# Patient Record
Sex: Female | Born: 1950
Health system: Southern US, Community
[De-identification: ages and names within clinical notes are randomized; demographics above are authoritative.]

## PROBLEM LIST (undated history)

## (undated) DIAGNOSIS — K219 Gastro-esophageal reflux disease without esophagitis: Secondary | ICD-10-CM

## (undated) DIAGNOSIS — M8580 Other specified disorders of bone density and structure, unspecified site: Secondary | ICD-10-CM

## (undated) DIAGNOSIS — E119 Type 2 diabetes mellitus without complications: Secondary | ICD-10-CM

## (undated) DIAGNOSIS — E559 Vitamin D deficiency, unspecified: Secondary | ICD-10-CM

## (undated) DIAGNOSIS — E785 Hyperlipidemia, unspecified: Secondary | ICD-10-CM

## (undated) DIAGNOSIS — Z780 Asymptomatic menopausal state: Secondary | ICD-10-CM

## (undated) DIAGNOSIS — I071 Rheumatic tricuspid insufficiency: Secondary | ICD-10-CM

## (undated) DIAGNOSIS — F410 Panic disorder [episodic paroxysmal anxiety] without agoraphobia: Secondary | ICD-10-CM

## (undated) DIAGNOSIS — I10 Essential (primary) hypertension: Secondary | ICD-10-CM

## (undated) HISTORY — PX: OVARIAN CYST REMOVAL: SHX89

## (undated) HISTORY — DX: Gastro-esophageal reflux disease without esophagitis: K21.9

## (undated) HISTORY — PX: CATARACT EXTRACTION: SUR2

## (undated) HISTORY — DX: Rheumatic tricuspid insufficiency: I07.1

## (undated) HISTORY — PX: OTHER SURGICAL HISTORY: SHX169

## (undated) HISTORY — DX: Asymptomatic menopausal state: Z78.0

## (undated) HISTORY — DX: Other specified disorders of bone density and structure, unspecified site: M85.80

## (undated) HISTORY — DX: Vitamin D deficiency, unspecified: E55.9

## (undated) HISTORY — DX: Essential (primary) hypertension: I10

## (undated) HISTORY — DX: Panic disorder (episodic paroxysmal anxiety): F41.0

## (undated) HISTORY — DX: Type 2 diabetes mellitus without complications: E11.9

## (undated) HISTORY — DX: Hyperlipidemia, unspecified: E78.5

---

## 1997-12-24 ENCOUNTER — Observation Stay (HOSPITAL_COMMUNITY): Admission: RE | Admit: 1997-12-24 | Discharge: 1997-12-25 | Payer: Self-pay | Admitting: Obstetrics and Gynecology

## 1999-12-15 ENCOUNTER — Encounter: Payer: Self-pay | Admitting: Internal Medicine

## 1999-12-15 ENCOUNTER — Encounter: Admission: RE | Admit: 1999-12-15 | Discharge: 1999-12-15 | Payer: Self-pay | Admitting: Internal Medicine

## 2000-10-05 ENCOUNTER — Ambulatory Visit (HOSPITAL_COMMUNITY): Admission: RE | Admit: 2000-10-05 | Discharge: 2000-10-05 | Payer: Self-pay | Admitting: Gynecology

## 2000-11-01 ENCOUNTER — Other Ambulatory Visit: Admission: RE | Admit: 2000-11-01 | Discharge: 2000-11-01 | Payer: Self-pay | Admitting: Gynecology

## 2000-12-19 ENCOUNTER — Encounter: Admission: RE | Admit: 2000-12-19 | Discharge: 2000-12-19 | Payer: Self-pay | Admitting: Internal Medicine

## 2000-12-19 ENCOUNTER — Encounter: Payer: Self-pay | Admitting: Internal Medicine

## 2001-01-13 ENCOUNTER — Ambulatory Visit: Admission: RE | Admit: 2001-01-13 | Discharge: 2001-01-13 | Payer: Self-pay | Admitting: Internal Medicine

## 2001-05-01 ENCOUNTER — Other Ambulatory Visit: Admission: RE | Admit: 2001-05-01 | Discharge: 2001-05-01 | Payer: Self-pay | Admitting: Gynecology

## 2001-11-27 ENCOUNTER — Other Ambulatory Visit: Admission: RE | Admit: 2001-11-27 | Discharge: 2001-11-27 | Payer: Self-pay | Admitting: Gynecology

## 2002-01-17 ENCOUNTER — Encounter: Admission: RE | Admit: 2002-01-17 | Discharge: 2002-01-17 | Payer: Self-pay | Admitting: Internal Medicine

## 2002-01-17 ENCOUNTER — Encounter: Payer: Self-pay | Admitting: Internal Medicine

## 2002-05-01 ENCOUNTER — Other Ambulatory Visit: Admission: RE | Admit: 2002-05-01 | Discharge: 2002-05-01 | Payer: Self-pay | Admitting: Gynecology

## 2003-01-21 ENCOUNTER — Encounter: Payer: Self-pay | Admitting: Internal Medicine

## 2003-01-21 ENCOUNTER — Ambulatory Visit (HOSPITAL_COMMUNITY): Admission: RE | Admit: 2003-01-21 | Discharge: 2003-01-21 | Payer: Self-pay | Admitting: Internal Medicine

## 2003-02-19 ENCOUNTER — Other Ambulatory Visit: Admission: RE | Admit: 2003-02-19 | Discharge: 2003-02-19 | Payer: Self-pay | Admitting: Gynecology

## 2003-03-06 ENCOUNTER — Encounter: Admission: RE | Admit: 2003-03-06 | Discharge: 2003-03-06 | Payer: Self-pay | Admitting: Gynecology

## 2003-03-06 ENCOUNTER — Encounter: Payer: Self-pay | Admitting: Gynecology

## 2003-09-04 ENCOUNTER — Encounter: Payer: Self-pay | Admitting: Internal Medicine

## 2003-09-04 ENCOUNTER — Encounter: Admission: RE | Admit: 2003-09-04 | Discharge: 2003-09-04 | Payer: Self-pay | Admitting: Internal Medicine

## 2004-01-01 ENCOUNTER — Encounter: Admission: RE | Admit: 2004-01-01 | Discharge: 2004-03-31 | Payer: Self-pay | Admitting: Internal Medicine

## 2004-01-22 ENCOUNTER — Ambulatory Visit (HOSPITAL_COMMUNITY): Admission: RE | Admit: 2004-01-22 | Discharge: 2004-01-22 | Payer: Self-pay | Admitting: Internal Medicine

## 2004-02-20 ENCOUNTER — Other Ambulatory Visit: Admission: RE | Admit: 2004-02-20 | Discharge: 2004-02-20 | Payer: Self-pay | Admitting: Gynecology

## 2004-04-29 ENCOUNTER — Encounter: Admission: RE | Admit: 2004-04-29 | Discharge: 2004-07-28 | Payer: Self-pay | Admitting: Internal Medicine

## 2004-10-21 ENCOUNTER — Encounter: Admission: RE | Admit: 2004-10-21 | Discharge: 2004-10-21 | Payer: Self-pay | Admitting: Internal Medicine

## 2005-01-21 ENCOUNTER — Encounter: Admission: RE | Admit: 2005-01-21 | Discharge: 2005-04-21 | Payer: Self-pay | Admitting: Internal Medicine

## 2005-01-26 ENCOUNTER — Ambulatory Visit (HOSPITAL_COMMUNITY): Admission: RE | Admit: 2005-01-26 | Discharge: 2005-01-26 | Payer: Self-pay | Admitting: Gynecology

## 2005-02-23 ENCOUNTER — Other Ambulatory Visit: Admission: RE | Admit: 2005-02-23 | Discharge: 2005-02-23 | Payer: Self-pay | Admitting: Gynecology

## 2005-07-20 ENCOUNTER — Encounter: Admission: RE | Admit: 2005-07-20 | Discharge: 2005-07-20 | Payer: Self-pay | Admitting: Internal Medicine

## 2005-07-27 ENCOUNTER — Encounter: Admission: RE | Admit: 2005-07-27 | Discharge: 2005-10-25 | Payer: Self-pay | Admitting: Internal Medicine

## 2006-02-02 ENCOUNTER — Ambulatory Visit (HOSPITAL_COMMUNITY): Admission: RE | Admit: 2006-02-02 | Discharge: 2006-02-02 | Payer: Self-pay | Admitting: Internal Medicine

## 2006-03-02 ENCOUNTER — Encounter: Admission: RE | Admit: 2006-03-02 | Discharge: 2006-03-02 | Payer: Self-pay | Admitting: Internal Medicine

## 2006-03-07 ENCOUNTER — Other Ambulatory Visit: Admission: RE | Admit: 2006-03-07 | Discharge: 2006-03-07 | Payer: Self-pay | Admitting: Gynecology

## 2006-07-26 ENCOUNTER — Other Ambulatory Visit: Admission: RE | Admit: 2006-07-26 | Discharge: 2006-07-26 | Payer: Self-pay | Admitting: Internal Medicine

## 2007-02-14 ENCOUNTER — Ambulatory Visit (HOSPITAL_COMMUNITY): Admission: RE | Admit: 2007-02-14 | Discharge: 2007-02-14 | Payer: Self-pay | Admitting: Unknown Physician Specialty

## 2007-05-31 ENCOUNTER — Other Ambulatory Visit: Admission: RE | Admit: 2007-05-31 | Discharge: 2007-05-31 | Payer: Self-pay | Admitting: Obstetrics and Gynecology

## 2007-08-02 ENCOUNTER — Encounter: Admission: RE | Admit: 2007-08-02 | Discharge: 2007-08-02 | Payer: Self-pay | Admitting: Family Medicine

## 2008-02-16 ENCOUNTER — Ambulatory Visit (HOSPITAL_COMMUNITY): Admission: RE | Admit: 2008-02-16 | Discharge: 2008-02-16 | Payer: Self-pay | Admitting: Family Medicine

## 2009-01-29 ENCOUNTER — Other Ambulatory Visit: Admission: RE | Admit: 2009-01-29 | Discharge: 2009-01-29 | Payer: Self-pay | Admitting: Family Medicine

## 2009-02-05 ENCOUNTER — Encounter: Admission: RE | Admit: 2009-02-05 | Discharge: 2009-02-05 | Payer: Self-pay | Admitting: Otolaryngology

## 2009-02-24 ENCOUNTER — Ambulatory Visit (HOSPITAL_COMMUNITY): Admission: RE | Admit: 2009-02-24 | Discharge: 2009-02-24 | Payer: Self-pay | Admitting: Family Medicine

## 2010-02-02 ENCOUNTER — Other Ambulatory Visit: Admission: RE | Admit: 2010-02-02 | Discharge: 2010-02-02 | Payer: Self-pay | Admitting: *Deleted

## 2010-02-25 ENCOUNTER — Ambulatory Visit (HOSPITAL_COMMUNITY): Admission: RE | Admit: 2010-02-25 | Discharge: 2010-02-25 | Payer: Self-pay | Admitting: Family Medicine

## 2010-12-13 ENCOUNTER — Encounter: Payer: Self-pay | Admitting: Family Medicine

## 2011-01-25 ENCOUNTER — Other Ambulatory Visit (HOSPITAL_COMMUNITY): Payer: Self-pay | Admitting: Family Medicine

## 2011-01-25 DIAGNOSIS — Z1231 Encounter for screening mammogram for malignant neoplasm of breast: Secondary | ICD-10-CM

## 2011-03-03 ENCOUNTER — Ambulatory Visit (HOSPITAL_COMMUNITY)
Admission: RE | Admit: 2011-03-03 | Discharge: 2011-03-03 | Disposition: A | Discharge status: Home / Self Care | Payer: BC Managed Care – HMO | Source: Ambulatory Visit | Attending: Family Medicine | Admitting: Family Medicine

## 2011-03-03 DIAGNOSIS — Z1231 Encounter for screening mammogram for malignant neoplasm of breast: Secondary | ICD-10-CM

## 2011-03-04 ENCOUNTER — Other Ambulatory Visit: Payer: Self-pay | Admitting: Family Medicine

## 2011-03-04 DIAGNOSIS — R928 Other abnormal and inconclusive findings on diagnostic imaging of breast: Secondary | ICD-10-CM

## 2011-03-10 ENCOUNTER — Ambulatory Visit
Admission: RE | Admit: 2011-03-10 | Discharge: 2011-03-10 | Disposition: A | Discharge status: Home / Self Care | Payer: BC Managed Care – HMO | Source: Ambulatory Visit | Attending: Family Medicine | Admitting: Family Medicine

## 2011-03-10 DIAGNOSIS — R928 Other abnormal and inconclusive findings on diagnostic imaging of breast: Secondary | ICD-10-CM

## 2011-04-09 NOTE — Op Note (Signed)
[facility]  Patient:    SHONIKA, KOLASINSKI                        MRN: [id number] Proc. Date: 10/05/00 Adm. Date:  [id number] Attending:  Douglass Rivers                           Operative Report  POSTOPERATIVE DIAGNOSIS:      Recurrent Bartholins cyst.  POSTOPERATIVE DIAGNOSIS:      Recurrent bartholins cyst.  OPERATION/PROCEDURE:          Marsupialization.  SURGEON:                      Douglass Rivers, M.D.  ANESTHESIA:                   General.  ESTIMATED BLOOD LOSS:         Minimal.  FINDINGS:                     An approximate 5 cm right Bartholins cyst was noted with discharge.  Pathology was aerobic and anaerobic cultures.  COMPLICATIONS:                None.  DRAINS/PACKS:                 Iodoform packing.  DESCRIPTION OF PROCEDURE:     The patient was taken to the operating room and general anesthesia induced.  The patient was placed in the dorsal lithotomy position and pedal pulses in the usual sterile fashion.  The Bartholins cyst was clearly evident.  The labia was everted and the cyst was turned outward. The cyst was stabilized between two examining fingers and the scalpel and the vaginal mucosa incised with the scalpel.  The cyst wall was visible.  A stable incision through the cyst was performed and purulent looking discharge was seen.  Aerobic and anaerobic cultures were taken.  The incision in the cyst was then extended superiorly and inferiorly.  The cyst was drained and irrigated.  The cyst wall was then sutured in interrupted fashion with 3-0 Vicryl to the vaginal mucosa.  The area was again irrigated.  The area was noted to be free of loculations.  There was a small amount of bleeding noted in the base that was easily compressed with packing.  The patient was discharged home and will follow up in the office tomorrow to have packing removed. DD:  10/05/00 TD:  10/06/00 Job: [id number]

## 2012-01-08 ENCOUNTER — Ambulatory Visit: Payer: BC Managed Care – PPO

## 2012-01-08 ENCOUNTER — Ambulatory Visit (INDEPENDENT_AMBULATORY_CARE_PROVIDER_SITE_OTHER): Payer: BC Managed Care – PPO | Admitting: Internal Medicine

## 2012-01-08 VITALS — BP 110/68 | HR 100 | Temp 102.6°F | Resp 18 | Ht 62.25 in | Wt 132.4 lb

## 2012-01-08 DIAGNOSIS — R05 Cough: Secondary | ICD-10-CM

## 2012-01-08 DIAGNOSIS — R059 Cough, unspecified: Secondary | ICD-10-CM | POA: Insufficient documentation

## 2012-01-08 DIAGNOSIS — I10 Essential (primary) hypertension: Secondary | ICD-10-CM

## 2012-01-08 DIAGNOSIS — E119 Type 2 diabetes mellitus without complications: Secondary | ICD-10-CM | POA: Insufficient documentation

## 2012-01-08 DIAGNOSIS — E785 Hyperlipidemia, unspecified: Secondary | ICD-10-CM | POA: Insufficient documentation

## 2012-01-08 DIAGNOSIS — R509 Fever, unspecified: Secondary | ICD-10-CM

## 2012-01-08 LAB — POCT CBC
HCT, POC: 40.1 % (ref 37.7–47.9)
Hemoglobin: 12.6 g/dL (ref 12.2–16.2)
Lymph, poc: 1.2 (ref 0.6–3.4)
MCH, POC: 26.6 pg — AB (ref 27–31.2)
MCHC: 31.4 g/dL — AB (ref 31.8–35.4)
MCV: 84.7 fL (ref 80–97)
MPV: 8.1 fL (ref 0–99.8)
POC MID %: 6.2 %M (ref 0–12)
RBC: 4.73 M/uL (ref 4.04–5.48)
WBC: 9.6 10*3/uL (ref 4.6–10.2)

## 2012-01-08 MED ORDER — HYDROCODONE-HOMATROPINE 5-1.5 MG/5ML PO SYRP: 5.0000 mL | ORAL_SOLUTION | Freq: Four times a day (QID) | ORAL | Status: AC | PRN

## 2012-01-08 MED ORDER — AZITHROMYCIN 500 MG PO TABS: 500.0000 mg | ORAL_TABLET | Freq: Every day | ORAL | Status: AC

## 2012-01-08 NOTE — Progress Notes (Signed)
  Subjective:    Patient ID: [name], female    DOB: 1951-05-22, 61 y.o.   MRN: [id number]  HPI3 year old presents with fever and cough for over one month on an intermittent basis. Most recently she has been sick with fever for 3 days. The cough is occasionally productive. She has had high fever for 3 days and had to miss work. There is no runny nose or sore throat. This is her first visit here and she says she has no other problems on her medication list would indicate that she has diabetes hypertension and hyperlipidemia.    Review of Systemsno weight loss or night sweats She has no underlying heart problems There are no GI or GU symptoms for this visit     Objective:   Physical Exam She is well-developed in no acute distress and has mild obesity Temp is 102 Eyes are clear, tympanic membranes intact, nares clear, oropharynx is clear.  There is no cervical lymphadenopathy Chest has rales at the right base posteriorly The heart is regular without murmurs rubs or gallops      [facility] reading (PRIMARY) by  Dr. Merla Riches infiltrate at the right base .    Assessment & Plan:  Problem #1 cough Problem #2 fever Problem #3 community-acquired pneumonia #4 diabetes #5 hypertension  Plan Zithromax 500 daily for 5 days plus Hycodan Followup 4-5 days if not well

## 2012-02-07 ENCOUNTER — Other Ambulatory Visit (HOSPITAL_COMMUNITY): Payer: Self-pay | Admitting: Family Medicine

## 2012-02-07 DIAGNOSIS — Z1231 Encounter for screening mammogram for malignant neoplasm of breast: Secondary | ICD-10-CM

## 2012-03-07 ENCOUNTER — Ambulatory Visit (HOSPITAL_COMMUNITY)
Admission: RE | Admit: 2012-03-07 | Discharge: 2012-03-07 | Disposition: A | Discharge status: Home / Self Care | Payer: BC Managed Care – PPO | Source: Ambulatory Visit | Attending: Family Medicine | Admitting: Family Medicine

## 2012-03-07 DIAGNOSIS — Z1231 Encounter for screening mammogram for malignant neoplasm of breast: Secondary | ICD-10-CM | POA: Insufficient documentation

## 2012-03-14 ENCOUNTER — Other Ambulatory Visit (HOSPITAL_COMMUNITY)
Admission: RE | Admit: 2012-03-14 | Discharge: 2012-03-14 | Disposition: A | Discharge status: Home / Self Care | Payer: BC Managed Care – PPO | Source: Ambulatory Visit | Attending: Family Medicine | Admitting: Family Medicine

## 2012-03-14 ENCOUNTER — Other Ambulatory Visit: Payer: Self-pay | Admitting: Family Medicine

## 2012-03-14 DIAGNOSIS — Z01419 Encounter for gynecological examination (general) (routine) without abnormal findings: Secondary | ICD-10-CM | POA: Insufficient documentation

## 2013-02-05 ENCOUNTER — Other Ambulatory Visit (HOSPITAL_COMMUNITY): Payer: Self-pay | Admitting: Family Medicine

## 2013-02-05 DIAGNOSIS — Z1231 Encounter for screening mammogram for malignant neoplasm of breast: Secondary | ICD-10-CM

## 2013-03-13 ENCOUNTER — Ambulatory Visit (HOSPITAL_COMMUNITY)
Admission: RE | Admit: 2013-03-13 | Discharge: 2013-03-13 | Disposition: A | Discharge status: Home / Self Care | Payer: BC Managed Care – PPO | Source: Ambulatory Visit | Attending: Family Medicine | Admitting: Family Medicine

## 2013-03-13 DIAGNOSIS — Z1231 Encounter for screening mammogram for malignant neoplasm of breast: Secondary | ICD-10-CM | POA: Insufficient documentation

## 2013-09-29 ENCOUNTER — Encounter: Payer: Self-pay | Admitting: Interventional Cardiology

## 2013-10-01 ENCOUNTER — Ambulatory Visit (INDEPENDENT_AMBULATORY_CARE_PROVIDER_SITE_OTHER): Payer: BC Managed Care – HMO | Admitting: Interventional Cardiology

## 2013-10-01 ENCOUNTER — Encounter: Payer: Self-pay | Admitting: Interventional Cardiology

## 2013-10-01 VITALS — BP 120/72 | HR 88 | Ht 62.0 in | Wt 128.0 lb

## 2013-10-01 DIAGNOSIS — E785 Hyperlipidemia, unspecified: Secondary | ICD-10-CM

## 2013-10-01 DIAGNOSIS — I10 Essential (primary) hypertension: Secondary | ICD-10-CM

## 2013-10-01 DIAGNOSIS — R079 Chest pain, unspecified: Secondary | ICD-10-CM

## 2013-10-01 DIAGNOSIS — E119 Type 2 diabetes mellitus without complications: Secondary | ICD-10-CM

## 2013-10-01 DIAGNOSIS — Z8249 Family history of ischemic heart disease and other diseases of the circulatory system: Secondary | ICD-10-CM | POA: Insufficient documentation

## 2013-10-01 NOTE — Patient Instructions (Signed)
Your physician has requested that you have en exercise stress myoview. For further information please visit [email or web address]. Please follow instruction sheet, as given.   

## 2013-10-01 NOTE — Progress Notes (Signed)
Patient ID: [name], female   DOB: 1951/04/15, 62 y.o.   MRN: [id number]     Patient ID: [name] MRN: [id number] DOB/AGE: 07-18-1951 62 y.o.   Referring Physician Nadyne Coombes, FNP, Delbert Harness, M.D.   Reason for Consultation chest discomfort  HPI: 62 y/o who has been having chest pains occasionally while she walks. Usually, she is fine with working. She uses a treadmill and does 30 minutes of exercise.  After 15 minutes, she gets chest pain. She feels tired sometimes. No rest pain. No sweating or dizziness with the pain. No syncope. Blood sugars are usually in the 120-130 range. BP 130 range systolic.  She was seen by her primary care doctor. Sublingual nitroglycerin was given to her. She has not used this medication yet since she has not had symptoms. She does feel that the chest discomfort is coming on at least twice a week. Prior to her evaluation in 2011, she did not have discomfort while on the treadmill. It would only occur at work. More recently, she is having problems on the treadmill and it makes her stop.    Current Outpatient Prescriptions  Medication Sig Dispense Refill  . amLODipine (NORVASC) 10 MG tablet Take 10 mg by mouth daily.      Marland Kitchen atorvastatin (LIPITOR) 10 MG tablet Take 10 mg by mouth daily.      . cholecalciferol (VITAMIN D) 1000 UNITS tablet Take 1,000 Units by mouth daily.      . fish oil-omega-3 fatty acids 1000 MG capsule Take 2 g by mouth daily.      Marland Kitchen gemfibrozil (LOPID) 600 MG tablet       . lisinopril-hydrochlorothiazide (PRINZIDE,ZESTORETIC) 20-12.5 MG per tablet Take 1 tablet by mouth daily.      Marland Kitchen lovastatin (MEVACOR) 40 MG tablet       . metFORMIN (GLUCOPHAGE) 500 MG tablet Take by mouth 2 (two) times daily with a meal.      . Multiple Vitamin (MULTIVITAMIN) tablet Take 1 tablet by mouth daily.      Marland Kitchen NITROSTAT 0.3 MG SL tablet       . omeprazole (PRILOSEC) 40 MG capsule Take 40 mg by mouth daily.       No current facility-administered  medications for this visit.   Past Medical History  Diagnosis Date  . Hypertension   . GERD (gastroesophageal reflux disease)   . Hyperlipidemia     mixed  . Diabetes mellitus without complication     type 2 previously well controlled with diet and excercise ,medication iintitated 2010  . Panic attacks   . Tricuspid regurgitation     with resulting heart murmur   . Vitamin D deficiency   . Postmenopausal     No family history on file.  History   Social History  . Marital Status: Married    Spouse Name: N/A    Number of Children: N/A  . Years of Education: N/A   Occupational History  . Not on file.   Social History Main Topics  . Smoking status: Never Smoker   . Smokeless tobacco: Not on file  . Alcohol Use: Not on file  . Drug Use: Not on file  . Sexual Activity: Not on file   Other Topics Concern  . Not on file   Social History Narrative   No smoking    No alcohol    No caffeine   No recreational drug use    Diet -yes   Exercise  Occupation : Agricultural engineer at [employer].   Marital Status   Married   Children 3 boys    Seat belt yes          No past surgical history on file.    (Not in a hospital admission)  Review of systems complete and found to be negative unless listed above .  No nausea, vomiting.  No fever chills, No focal weakness,  No palpitations.  Physical Exam: Filed Vitals:   10/01/13 0843  BP: 120/72  Pulse: 88    Weight: 128 lb (58.06 kg)  Physical exam:  [place]/AT EOMI No JVD, No carotid bruit RRR S1S2  No wheezing Soft. NT, nondistended No edema. 2+ posterior tibial pulses bilaterally No focal motor or sensory deficits Normal affect  Labs:   Lab Results  Component Value Date   WBC 9.6 01/08/2012   HGB 12.6 01/08/2012   HCT 40.1 01/08/2012   MCV 84.7 01/08/2012   No results found for this basename: NA, K, CL, CO2, BUN, CREATININE, CALCIUM, LABALBU, PROT, BILITOT, ALKPHOS, ALT, AST, GLUCOSE,  in the last 168 hours No results  found for this basename: CKTOTAL, CKMB, CKMBINDEX, TROPONINI    No results found for this basename: CHOL   No results found for this basename: HDL   No results found for this basename: LDLCALC   No results found for this basename: TRIG   No results found for this basename: CHOLHDL   No results found for this basename: LDLDIRECT       EKG: Normal sinus rhythm, Q waves in lead 3, nonspecific ST segment changes , particularly in lead V1 and aVL  ASSESSMENT AND PLAN:  1. Chest pain 2. Family h/o CAD: Mother with MI at young age but lived to 60 y/o.  No heart disease in siblings. Chest pain  Diagnostic Imaging:EKG Harward,Amy 10/06/2010 12:15:42 PM < Blaine Guiffre,JAY 10/01/2013 12:54:24 PM > normal sinus rhythm, Q wave in lead 3, EC Exercise Tolerence Test (ETT) Manasseh Pittsley,JAY 10/09/2010 01:22:51 PM > 7:05 on treadmill. No clear ischemia. No symptoms, Performed in November 2011.  Some atypical feature, but she does have risk factors. Normal treadmill test about 3 years ago. Given her risk factors for heart disease, will check nuclear stress test. She does have diabetes for several years. She states that her mother had an MI in her 30s..  2. Mixed hyperlipidemia  Continue Atorvastatin Tablet, 10 MG, 1 tablet, Orally, Once a day LDL 97 in 2011 while on lovastatin. Most recent labs are not available. Below target.  3. Essential hypertension, benign  Continue Lisinopril-Hydrochlorothiazide Tablet, 20-12.5 MG, 1 tablet, Orally, Once a day Continue Amlodipine Besylate Tablet, 10, 1 tablet, by mouth, once a day COntrolled.   Signed:   Fredric Mare, MD, [facility] 10/01/2013, 9:05 AM

## 2013-10-23 ENCOUNTER — Ambulatory Visit (HOSPITAL_COMMUNITY): Payer: BC Managed Care – PPO | Attending: Cardiology | Admitting: Radiology

## 2013-10-23 ENCOUNTER — Encounter: Payer: Self-pay | Admitting: Cardiology

## 2013-10-23 VITALS — BP 155/82 | Ht 62.0 in | Wt 124.0 lb

## 2013-10-23 DIAGNOSIS — I10 Essential (primary) hypertension: Secondary | ICD-10-CM | POA: Insufficient documentation

## 2013-10-23 DIAGNOSIS — E119 Type 2 diabetes mellitus without complications: Secondary | ICD-10-CM

## 2013-10-23 DIAGNOSIS — R079 Chest pain, unspecified: Secondary | ICD-10-CM

## 2013-10-23 DIAGNOSIS — E785 Hyperlipidemia, unspecified: Secondary | ICD-10-CM | POA: Insufficient documentation

## 2013-10-23 DIAGNOSIS — Z8249 Family history of ischemic heart disease and other diseases of the circulatory system: Secondary | ICD-10-CM | POA: Insufficient documentation

## 2013-10-23 MED ORDER — TECHNETIUM TC 99M SESTAMIBI GENERIC - CARDIOLITE
10.0000 | Freq: Once | INTRAVENOUS | Status: AC | PRN
  Administered 2013-10-23: 10 via INTRAVENOUS

## 2013-10-23 MED ORDER — TECHNETIUM TC 99M SESTAMIBI GENERIC - CARDIOLITE
30.0000 | Freq: Once | INTRAVENOUS | Status: AC | PRN
  Administered 2013-10-23: 30 via INTRAVENOUS

## 2013-10-23 NOTE — Progress Notes (Signed)
  [facility] SITE 3 NUCLEAR MED [address] [place], [place] [address] [phone]    Cardiology Nuclear Med Study  [name] is a 62 y.o. female     MRN : [id number]     DOB: Jun 24, 1951  Procedure Date: 10/23/2013  Nuclear Med Background Indication for Stress Test:  Evaluation for Ischemia History:  20011 MPI: NL Cardiac Risk Factors: Family History - CAD, Hypertension, Lipids and NIDDM  Symptoms:  Chest Pain   Nuclear Pre-Procedure Caffeine/Decaff Intake:  None NPO After: 10:00pm   Lungs:  clear O2 Sat: 97% on room air. IV 0.9% NS with Angio Cath:  22g  IV Site: R Hand  IV Started by:  Cathlyn Parsons, RN  Chest Size (in):  34 Cup Size: B  Height: 5\' 2"  (1.575 m)  Weight:  124 lb (56.246 kg)  BMI:  Body mass index is 22.67 kg/(m^2). Tech Comments:  n/a    Nuclear Med Study 1 or 2 day study: 1 day  Stress Test Type:  Stress  Reading MD: Lance Muss, MD  Order Authorizing Provider:  Tonna Corner  Resting Radionuclide: Technetium 75m Sestamibi  Resting Radionuclide Dose: 11.0 mCi   Stress Radionuclide:  Technetium 15m Sestamibi  Stress Radionuclide Dose: 33.0 mCi           Stress Protocol Rest HR: 73 Stress HR: 142  Rest BP: 155/82 Stress BP: 171/72  Exercise Time (min): 7:30 METS: 8.30   Predicted Max HR: 160 bpm % Max HR: 88.75 bpm Rate Pressure Product: 95284   Dose of Adenosine (mg):  n/a Dose of Lexiscan: n/a mg  Dose of Atropine (mg): n/a Dose of Dobutamine: n/a mcg/kg/min (at max HR)  Stress Test Technologist: Milana Na, EMT-P  Nuclear Technologist:  Domenic Polite, CNMT     Rest Procedure:  Myocardial perfusion imaging was performed at rest 45 minutes following the intravenous administration of Technetium 50m Sestamibi. Rest ECG: NSR - Normal EKG  Stress Procedure:  The patient exercised on the treadmill utilizing the Bruce Protocol for 7:30 minutes. The patient stopped due to fatigue and denied any chest pain.   Technetium 70m Sestamibi was injected at peak exercise and myocardial perfusion imaging was performed after a brief delay. Stress ECG: Insignificant upsloping ST segment depression.  QPS Raw Data Images:  Normal; no motion artifact; normal heart/lung ratio. Stress Images:  Normal homogeneous uptake in all areas of the myocardium. Rest Images:  Normal homogeneous uptake in all areas of the myocardium. Subtraction (SDS):  No evidence of ischemia. Transient Ischemic Dilatation (Normal <1.22):  0.95 Lung/Heart Ratio (Normal <0.45):  0.22  Quantitative Gated Spect Images QGS EDV:  53 ml QGS ESV:  12 ml  Impression Exercise Capacity:  Good exercise capacity. BP Response:  Hypertensive blood pressure response. Clinical Symptoms:  No significant symptoms noted. ECG Impression:  No significant ST segment change suggestive of ischemia. Comparison with Prior Nuclear Study: No images to compare  Overall Impression:  Low risk stress nuclear study . No clear evidence of ischemia..  LV Ejection Fraction: 77%.  LV Wall Motion:  NL LV Function; NL Wall Motion  Corky Crafts., MD, [facility]

## 2014-02-14 ENCOUNTER — Other Ambulatory Visit (HOSPITAL_COMMUNITY): Payer: Self-pay | Admitting: Family Medicine

## 2014-02-14 DIAGNOSIS — Z1231 Encounter for screening mammogram for malignant neoplasm of breast: Secondary | ICD-10-CM

## 2014-03-26 ENCOUNTER — Encounter (INDEPENDENT_AMBULATORY_CARE_PROVIDER_SITE_OTHER): Payer: Self-pay

## 2014-03-26 ENCOUNTER — Ambulatory Visit (HOSPITAL_COMMUNITY)
Admission: RE | Admit: 2014-03-26 | Discharge: 2014-03-26 | Disposition: A | Discharge status: Home / Self Care | Payer: BC Managed Care – PPO | Source: Ambulatory Visit | Attending: Family Medicine | Admitting: Family Medicine

## 2014-03-26 DIAGNOSIS — Z1231 Encounter for screening mammogram for malignant neoplasm of breast: Secondary | ICD-10-CM | POA: Insufficient documentation

## 2014-04-14 ENCOUNTER — Other Ambulatory Visit: Payer: Self-pay | Admitting: Family Medicine

## 2014-04-16 ENCOUNTER — Other Ambulatory Visit: Payer: Self-pay | Admitting: Family Medicine

## 2014-04-17 ENCOUNTER — Other Ambulatory Visit: Payer: Self-pay | Admitting: Family Medicine

## 2014-06-27 ENCOUNTER — Encounter: Payer: Self-pay | Admitting: Interventional Cardiology

## 2015-04-07 ENCOUNTER — Other Ambulatory Visit (HOSPITAL_COMMUNITY): Payer: Self-pay | Admitting: Family Medicine

## 2015-04-07 DIAGNOSIS — Z1231 Encounter for screening mammogram for malignant neoplasm of breast: Secondary | ICD-10-CM

## 2015-04-14 ENCOUNTER — Ambulatory Visit (HOSPITAL_COMMUNITY)
Admission: RE | Admit: 2015-04-14 | Discharge: 2015-04-14 | Disposition: A | Discharge status: Home / Self Care | Payer: BLUE CROSS/BLUE SHIELD | Source: Ambulatory Visit | Attending: Family Medicine | Admitting: Family Medicine

## 2015-04-14 DIAGNOSIS — Z1231 Encounter for screening mammogram for malignant neoplasm of breast: Secondary | ICD-10-CM | POA: Insufficient documentation

## 2016-03-25 ENCOUNTER — Other Ambulatory Visit: Payer: Self-pay

## 2016-03-25 DIAGNOSIS — Z1231 Encounter for screening mammogram for malignant neoplasm of breast: Secondary | ICD-10-CM

## 2016-04-20 ENCOUNTER — Ambulatory Visit
Admission: RE | Admit: 2016-04-20 | Discharge: 2016-04-20 | Disposition: A | Discharge status: Home / Self Care | Payer: BLUE CROSS/BLUE SHIELD | Source: Ambulatory Visit

## 2016-04-20 DIAGNOSIS — Z1231 Encounter for screening mammogram for malignant neoplasm of breast: Secondary | ICD-10-CM

## 2017-04-01 ENCOUNTER — Other Ambulatory Visit: Payer: Self-pay | Admitting: *Deleted

## 2017-04-01 DIAGNOSIS — Z1231 Encounter for screening mammogram for malignant neoplasm of breast: Secondary | ICD-10-CM

## 2017-04-26 ENCOUNTER — Ambulatory Visit
Admission: RE | Admit: 2017-04-26 | Discharge: 2017-04-26 | Disposition: A | Discharge status: Home / Self Care | Payer: BLUE CROSS/BLUE SHIELD | Source: Ambulatory Visit | Attending: *Deleted | Admitting: *Deleted

## 2017-04-26 DIAGNOSIS — Z1231 Encounter for screening mammogram for malignant neoplasm of breast: Secondary | ICD-10-CM

## 2017-12-13 NOTE — Progress Notes (Signed)
[facility] - Clinic Note  12/14/2017     CHIEF COMPLAINT Patient presents for Retina Evaluation   HISTORY OF PRESENT ILLNESS: [name] is a 67 y.o. female who presents to the clinic today for:   HPI    Retina Evaluation    In left eye.  Duration of 1 week.  Associated Symptoms Negative for Flashes, Blind Spot, Photophobia, Scalp Tenderness, Fever, Floaters, Pain, Glare, Jaw Claudication, Weight Loss, Distortion, Redness, Trauma, Shoulder/Hip pain and Fatigue.  Context:  distance vision, mid-range vision and near vision.  Response to treatment was no improvement.  I, the attending physician,  performed the HPI with the patient and updated documentation appropriately.          Comments    Pt presents today on the referral of Dr. Hanley Seamen for possible chorioretinal atrophic hole OD, pt states VA is blurry and waters a lot, she denies flashes, floaters, pain or wavy vision, pt is Type 2 diabetic and states she checks BS in the morning and it ranges from 136-140, pt uses [place] and refresh gtts and uses an ointment at night, pt takes fish oil pills also,        Last edited by Rennis Chris, MD on 12/14/2017 11:16 AM. (History)    Pt states VA is "just blurry"; Pt denies floaters, denies flashes, denies wavy VA; Pt reports OU water often;   Referring physician: Francene Boyers, MD [address] [place], [place] [address]  HISTORICAL INFORMATION:   Selected notes from the MEDICAL RECORD NUMBER Referred by Dr. Hanley Seamen for concern of possible retinal hole OS;  LEE- 01.16.19 (S. Bernstorf) [[facility] OD:  Ocular Hx- pseudophakia OU - toric (2017 by C. Groat), pinguecula, DES PMH- DM, HTN, elevated cholesterol     CURRENT MEDICATIONS: Current Outpatient Medications (Ophthalmic Drugs)  Medication Sig  . Lifitegrast (XIIDRA) 5 % SOLN    No current facility-administered medications for this visit.  (Ophthalmic Drugs)   Current Outpatient Medications (Other)   Medication Sig  . canagliflozin (INVOKANA) 100 MG TABS tablet TAKE ONE TABLET BY MOUTH ONCE DAILY  . Glucose Blood (BLOOD GLUCOSE TEST STRIPS) STRP Check fasting glucose readings once daily OneTouch please  . omeprazole (PRILOSEC) 40 MG capsule TAKE ONE CAPSULE BY MOUTH ONCE DAILY  . amLODipine (NORVASC) 10 MG tablet Take 10 mg by mouth daily.  Marland Kitchen atorvastatin (LIPITOR) 10 MG tablet Take 10 mg by mouth daily.  . cholecalciferol (VITAMIN D) 1000 UNITS tablet Take 1,000 Units by mouth daily.  . fish oil-omega-3 fatty acids 1000 MG capsule Take 2 g by mouth daily.  Marland Kitchen gemfibrozil (LOPID) 600 MG tablet Take 600 mg by mouth 2 (two) times daily before a meal.   . lisinopril-hydrochlorothiazide (PRINZIDE,ZESTORETIC) 20-12.5 MG per tablet Take 1 tablet by mouth daily.  Marland Kitchen lovastatin (MEVACOR) 40 MG tablet   . metFORMIN (GLUCOPHAGE) 500 MG tablet Take by mouth 2 (two) times daily with a meal.  . Multiple Vitamin (MULTIVITAMIN) tablet Take 1 tablet by mouth daily.  Marland Kitchen NITROSTAT 0.3 MG SL tablet Place 0.3 mg under the tongue every 5 (five) minutes as needed.   Marland Kitchen omeprazole (PRILOSEC) 40 MG capsule Take 40 mg by mouth daily.   No current facility-administered medications for this visit.  (Other)      REVIEW OF SYSTEMS: ROS    Negative for: Constitutional, Gastrointestinal, Neurological, Skin, Genitourinary, Musculoskeletal, HENT, Endocrine, Cardiovascular, Eyes, Respiratory, Psychiatric, Allergic/Imm, Heme/Lymph   Last edited by Concepcion Elk  on 12/14/2017 10:08 AM. (History)       ALLERGIES No Known Allergies  PAST MEDICAL HISTORY Past Medical History:  Diagnosis Date  . Diabetes mellitus without complication (HCC)    type 2 previously well controlled with diet and excercise ,medication iintitated 2010  . GERD (gastroesophageal reflux disease)   . Hyperlipidemia    mixed  . Hypertension   . Panic attacks   . Postmenopausal   . Tricuspid regurgitation    with resulting heart murmur    . Vitamin D deficiency    Past Surgical History:  Procedure Laterality Date  . CATARACT EXTRACTION     OU around July 2018    FAMILY HISTORY Family History  Problem Relation Age of Onset  . Heart disease Mother   . Blindness Mother   . Cataracts Mother   . Diabetes Mother   . Cataracts Father   . Diabetes Father   . Amblyopia Neg Hx   . Glaucoma Neg Hx   . Macular degeneration Neg Hx   . Retinal detachment Neg Hx   . Strabismus Neg Hx   . Retinitis pigmentosa Neg Hx     SOCIAL HISTORY Social History   Tobacco Use  . Smoking status: Never Smoker  . Smokeless tobacco: Never Used  Substance Use Topics  . Alcohol use: Not on file  . Drug use: Not on file         OPHTHALMIC EXAM:  Base Eye Exam    Visual Acuity (Snellen - Linear)      Right Left   Dist cc 20/20 -1 20/20 -1   Dist ph cc NI NI   Correction:  Glasses       Tonometry (Tonopen, 8:50 AM)      Right Left   Pressure 21 24       Tonometry #2 (Tonopen, 8:50 AM)      Right Left   Pressure 24 24       Pupils      Dark Light Shape React APD   Right 3 1 Round Brisk None   Left 3 1 Round Brisk None       Visual Fields (Counting fingers)      Left Right    Full Full       Extraocular Movement      Right Left    Full, Ortho Full, Ortho       Neuro/Psych    Oriented x3:  Yes   Mood/Affect:  Normal       Dilation    Both eyes:  1.0% Mydriacyl, 2.5% Phenylephrine @ 8:50 AM        Slit Lamp and Fundus Exam    Slit Lamp Exam      Right Left   Lids/Lashes Dermatochalasis - upper lid Dermatochalasis - upper lid   Conjunctiva/Sclera White and quiet Mild Temporal Pinguecula   Cornea 2+ diffuse Punctate epithelial erosions, Well healed temporal cataract wounds, early nasal band K 1+ Punctate epithelial erosions, early nasal band K   Anterior Chamber Deep and quiet Deep and quiet   Iris Round and dilated Round and dilated   Lens Toric PC IOL in good position with makes at 0200 and 0800  Toric PC IOL in good position with marks at 0930 and 0330   Vitreous Vitreous syneresis, Posterior vitreous detachment Vitreous syneresis       Fundus Exam      Right Left   Disc Normal Normal   C/D Ratio 0.4  0.4   Macula Flat, trace Epiretinal membrane, trace Drusen, No heme or edema Flat, Retinal pigment epithelial mottling, No heme or edema   Vessels Mild Copper wiring Mild Copper wiring   Periphery Attached Attached, Round area of chorioretinal atrophy at 0130 mid-zone; no SRF, tear, or detachment        Refraction    Wearing Rx      Sphere Cylinder Axis   Right +1.75 +0.50 120   Left +1.75 +0.50 113          IMAGING AND PROCEDURES  Imaging and Procedures for 12/14/17  OCT, Retina - OU - Both Eyes     Right Eye Quality was good. Central Foveal Thickness: 287. Progression has no prior data. Findings include normal foveal contour, no IRF, no SRF, epiretinal membrane, retinal drusen  (Trace ERM, trace drusen).   Left Eye Quality was good. Central Foveal Thickness: 278. Progression has no prior data. Findings include normal foveal contour, no IRF, no SRF, retinal drusen , epiretinal membrane (Trace ERM, Trace drusen).   Notes *Images captured and stored on drive  Diagnosis / Impression:  Trace drusen, ERM OU, No IRF/SRF  Clinical management:  See below  Abbreviations: NFP - Normal foveal profile. CME - cystoid macular edema. PED - pigment epithelial detachment. IRF - intraretinal fluid. SRF - subretinal fluid. EZ - ellipsoid zone. ERM - epiretinal membrane. ORA - outer retinal atrophy. ORT - outer retinal tubulation. SRHM - subretinal hyper-reflective material                  ASSESSMENT/PLAN:    ICD-10-CM   1. Chorioretinal atrophy of left eye H31.102   2. Retinal edema H35.81 OCT, Retina - OU - Both Eyes  3. Diabetes mellitus type 2 without retinopathy (HCC) E11.9   4. Posterior vitreous detachment of right eye H43.811   5. Pseudophakia of both eyes  Z96.1     1. Focal, chorioretinal atrophy OS - round area of CR atrophy at 130 equator - no subretinal fluid or retinal break associated - OCT through lesion without intraretinal or subretinal fluid - discussed findings and prognosis - no intervention indicated at this time - monitor  2. No retinal edema on exam or OCT  3. Diabetes mellitus, type 2 without retinopathy - The incidence, risk factors for progression, natural history and treatment options for diabetic retinopathy  were discussed with patient.   - The need for close monitoring of blood glucose, blood pressure, and serum lipids, avoiding cigarette or any type of tobacco, and the need for long term follow up was also discussed with patient. - f/u in 1 year, sooner prn  4. PVD OD / vitreous syneresis OU  Discussed findings and prognosis  No RT or RD on 360 scleral depressed exam  Reviewed s/s of RT/RD  Strict return precautions for any such RT/RD signs/symptoms  F/U PRN  5. Pseudophakia OU  - s/p CE/IOL OU by Dr. Zetta Bills  - beautiful surgeries, doing well  - monitor   Ophthalmic Meds Ordered this visit:  No orders of the defined types were placed in this encounter.      Return if symptoms worsen or fail to improve.  There are no Patient Instructions on file for this visit.   Explained the diagnoses, plan, and follow up with the patient and they expressed understanding.  Patient expressed understanding of the importance of proper follow up care.   This document serves as a record of services personally performed  by Karie Chimera, MD, PhD. It was created on their behalf by Virgilio Belling, COA, a certified ophthalmic assistant. The creation of this record is the provider's dictation and/or activities during the visit.  Electronically signed by: Virgilio Belling, COA  12/14/17 11:22 AM    Karie Chimera, M.D., Ph.D. Diseases & Surgery of the Retina and Vitreous [facility] 12/14/17   I have reviewed the above documentation for accuracy and completeness, and I agree with the above. Karie Chimera, M.D., Ph.D. 12/14/17 11:23 AM     Abbreviations: M myopia (nearsighted); A astigmatism; H hyperopia (farsighted); P presbyopia; Mrx spectacle prescription;  CTL contact lenses; OD right eye; OS left eye; OU both eyes  XT exotropia; ET esotropia; PEK punctate epithelial keratitis; PEE punctate epithelial erosions; DES dry eye syndrome; MGD meibomian gland dysfunction; ATs artificial tears; PFAT's preservative free artificial tears; NSC nuclear sclerotic cataract; PSC posterior subcapsular cataract; ERM epi-retinal membrane; PVD posterior vitreous detachment; RD retinal detachment; DM diabetes mellitus; DR diabetic retinopathy; NPDR non-proliferative diabetic retinopathy; PDR proliferative diabetic retinopathy; CSME clinically significant macular edema; DME diabetic macular edema; dbh dot blot hemorrhages; CWS cotton wool spot; POAG primary open angle glaucoma; C/D cup-to-disc ratio; HVF humphrey visual field; GVF goldmann visual field; OCT optical coherence tomography; IOP intraocular pressure; BRVO Branch retinal vein occlusion; CRVO central retinal vein occlusion; CRAO central retinal artery occlusion; BRAO branch retinal artery occlusion; RT retinal tear; SB scleral buckle; PPV pars plana vitrectomy; VH Vitreous hemorrhage; PRP panretinal laser photocoagulation; IVK intravitreal kenalog; VMT vitreomacular traction; MH Macular hole;  NVD neovascularization of the disc; NVE neovascularization elsewhere; AREDS age related eye disease study; ARMD age related macular degeneration; POAG primary open angle glaucoma; EBMD epithelial/anterior basement membrane dystrophy; ACIOL anterior chamber intraocular lens; IOL intraocular lens; PCIOL posterior chamber intraocular lens; Phaco/IOL phacoemulsification with intraocular lens placement; PRK photorefractive keratectomy; LASIK laser  assisted in situ keratomileusis; HTN hypertension; DM diabetes mellitus; COPD chronic obstructive pulmonary disease

## 2017-12-14 ENCOUNTER — Encounter (INDEPENDENT_AMBULATORY_CARE_PROVIDER_SITE_OTHER): Payer: Self-pay | Admitting: Ophthalmology

## 2017-12-14 ENCOUNTER — Ambulatory Visit (INDEPENDENT_AMBULATORY_CARE_PROVIDER_SITE_OTHER): Payer: BLUE CROSS/BLUE SHIELD | Admitting: Ophthalmology

## 2017-12-14 DIAGNOSIS — H31102 Choroidal degeneration, unspecified, left eye: Secondary | ICD-10-CM | POA: Diagnosis not present

## 2017-12-14 DIAGNOSIS — Z961 Presence of intraocular lens: Secondary | ICD-10-CM

## 2017-12-14 DIAGNOSIS — H43811 Vitreous degeneration, right eye: Secondary | ICD-10-CM | POA: Diagnosis not present

## 2017-12-14 DIAGNOSIS — H3581 Retinal edema: Secondary | ICD-10-CM | POA: Diagnosis not present

## 2017-12-14 DIAGNOSIS — E119 Type 2 diabetes mellitus without complications: Secondary | ICD-10-CM

## 2018-03-28 ENCOUNTER — Other Ambulatory Visit: Payer: Self-pay | Admitting: Neurology

## 2018-03-28 DIAGNOSIS — Z1231 Encounter for screening mammogram for malignant neoplasm of breast: Secondary | ICD-10-CM

## 2018-04-10 ENCOUNTER — Ambulatory Visit: Payer: BLUE CROSS/BLUE SHIELD | Admitting: Women's Health

## 2018-04-22 DIAGNOSIS — M8580 Other specified disorders of bone density and structure, unspecified site: Secondary | ICD-10-CM

## 2018-04-22 HISTORY — DX: Other specified disorders of bone density and structure, unspecified site: M85.80

## 2018-04-25 ENCOUNTER — Ambulatory Visit: Payer: BLUE CROSS/BLUE SHIELD | Admitting: Gynecology

## 2018-04-25 ENCOUNTER — Encounter: Payer: Self-pay | Admitting: Gynecology

## 2018-04-25 VITALS — BP 122/70 | Ht 62.0 in | Wt 124.0 lb

## 2018-04-25 DIAGNOSIS — Z01419 Encounter for gynecological examination (general) (routine) without abnormal findings: Secondary | ICD-10-CM

## 2018-04-25 DIAGNOSIS — N952 Postmenopausal atrophic vaginitis: Secondary | ICD-10-CM

## 2018-04-25 NOTE — Addendum Note (Signed)
Addended by: Dayna BarkerGARDNER, Adriane Gabbert K on: 04/25/2018 10:54 AM   Modules accepted: Orders

## 2018-04-25 NOTE — Patient Instructions (Signed)
Follow up for Bone Density as scheduled

## 2018-04-25 NOTE — Progress Notes (Signed)
    Ivor CostaBong T Tisdell 1951/02/23 [id number]        67 y.o.  G2P2 new patient for annual gynecologic exam.  It is been several years since her last exam.  She is without gynecologic complaints.  No significant hot flushes, night sweats, vaginal dryness or any vaginal bleeding.  Was recently evaluated for bleeding from her rectum with colonoscopy.  She is sure that it was rectal and not vaginal and reports that during the colonoscopy they did find blood.  Past medical history,surgical history, problem list, medications, allergies, family history and social history were all reviewed and documented as reviewed in the EPIC chart.  ROS:  Performed with pertinent positives and negatives included in the history, assessment and plan.   Additional significant findings : None   Exam: Kennon PortelaKim Gardner assistant Vitals:   04/25/18 1003  BP: 122/70  Weight: 124 lb (56.2 kg)  Height: 5\' 2"  (1.575 m)   Body mass index is 22.68 kg/m.  General appearance:  Normal affect, orientation and appearance. Skin: Grossly normal HEENT: Without gross lesions.  No cervical or supraclavicular adenopathy. Thyroid normal.  Lungs:  Clear without wheezing, rales or rhonchi Cardiac: RR, without RMG Abdominal:  Soft, nontender, without masses, guarding, rebound, organomegaly or hernia Breasts:  Examined lying and sitting without masses, retractions, discharge or axillary adenopathy. Pelvic:  Ext, BUS, Vagina: With atrophic changes  Cervix: With atrophic changes.  Pap smear done  Uterus: Anteverted, normal size, shape and contour, midline and mobile nontender   Adnexa: Without masses or tenderness    Anus and perineum: Normal   Rectovaginal: Normal sphincter tone without palpated masses or tenderness.    Assessment/Plan:  67 y.o. G2P2 female for annual gynecologic exam.   1. Postmenopausal/atrophic genital changes.  No significant hot flushes, night sweats, vaginal dryness or any bleeding.  Report any issues or  bleeding. 2. Mammography due now and I reminded her to schedule this.  Breast exam normal today. 3. Pap smear reported 2017.  Pap smear done today.  No verbal history of any abnormal Pap smears previously. 4. Colonoscopy 2019.  Repeat at their recommended interval. 5. DEXA never.  Recommended baseline DEXA given her thin status.  She does exercise on a regular basis.  Is on a multivitamin supplement.  Patient will schedule the bone density in follow-up for this. 6. Health maintenance.  No routine lab work done as patient does this elsewhere.  Follow-up in 1 year, sooner as needed.   Dara Lordsimothy P Nyliah Nierenberg MD, 10:40 AM 04/25/2018

## 2018-04-26 LAB — PAP IG W/ RFLX HPV ASCU

## 2018-05-04 ENCOUNTER — Ambulatory Visit (INDEPENDENT_AMBULATORY_CARE_PROVIDER_SITE_OTHER): Payer: BLUE CROSS/BLUE SHIELD

## 2018-05-04 ENCOUNTER — Other Ambulatory Visit: Payer: Self-pay | Admitting: Gynecology

## 2018-05-04 ENCOUNTER — Encounter: Payer: Self-pay | Admitting: Gynecology

## 2018-05-04 ENCOUNTER — Telehealth: Payer: Self-pay | Admitting: Gynecology

## 2018-05-04 DIAGNOSIS — M8589 Other specified disorders of bone density and structure, multiple sites: Secondary | ICD-10-CM

## 2018-05-04 DIAGNOSIS — Z13820 Encounter for screening for osteoporosis: Secondary | ICD-10-CM | POA: Diagnosis not present

## 2018-05-04 DIAGNOSIS — Z01419 Encounter for gynecological examination (general) (routine) without abnormal findings: Secondary | ICD-10-CM

## 2018-05-04 NOTE — Telephone Encounter (Signed)
Tell patient her most recent bone density shows osteopenia.  This is not to the degree of osteoporosis but she did show loss of bone at her spine and both hips from the previous study done in 2008.  I would recommend having a vitamin D level checked to make sure she is in the therapeutic range.  Regular weightbearing exercise such as walking is beneficial for the bones as well as adequate calcium intake at 1200 mg total dietary calcium daily.  I would recommend repeating the bone density in 2 years.

## 2018-05-05 ENCOUNTER — Encounter: Payer: Self-pay | Admitting: Gynecology

## 2018-05-05 ENCOUNTER — Ambulatory Visit: Payer: BLUE CROSS/BLUE SHIELD | Admitting: Gynecology

## 2018-05-05 VITALS — BP 120/70

## 2018-05-05 DIAGNOSIS — R1032 Left lower quadrant pain: Secondary | ICD-10-CM | POA: Diagnosis not present

## 2018-05-05 DIAGNOSIS — R350 Frequency of micturition: Secondary | ICD-10-CM | POA: Diagnosis not present

## 2018-05-05 NOTE — Addendum Note (Signed)
Addended by: Dayna BarkerGARDNER, Anyela Napierkowski K on: 05/05/2018 10:02 AM   Modules accepted: Orders

## 2018-05-05 NOTE — Progress Notes (Signed)
    Ivor CostaBong T Schuff 1951/06/16 [id number]        67 y.o.  G2P2 presents complaining of 3 months of left-sided low back to left pelvic pain.  Comes and goes.  Seems to get better when she stretches.  No nausea vomiting diarrhea constipation.  No urinary symptoms such as frequency dysuria urgency fever or chills.  She does have a history of right ovarian cyst that she had surgery on a number of years ago and she is worried that it might involve her left ovary.  No bleeding at all.  Past medical history,surgical history, problem list, medications, allergies, family history and social history were all reviewed and documented in the EPIC chart.  Directed ROS with pertinent positives and negatives documented in the history of present illness/assessment and plan.  Exam: Kennon PortelaKim Gardner assistant Vitals:   05/05/18 0814  BP: 120/70   General appearance:  Normal Spine straight without CVA tenderness or muscle spasm. Abdomen soft nontender without masses guarding rebound Pelvic external BUS vagina with atrophic changes.  Cervix with atrophic changes.  Uterus normal size midline mobile nontender.  Adnexa without masses or tenderness.  Rectal exam is normal.  Assessment/Plan:  67 y.o. G2P2 with 3 months of left low back to pelvic pain.  Seems to improve with stretching.  I suspect musculoskeletal.  Alternatives such as GI, GU and GYN were discussed with the patient.  Urine analysis today is overall unremarkable showing 0-5 WBC and 0-2 RBC.  Will check urine culture for completeness.  Recommend starting with a baseline ultrasound to rule out GYN pathology such as left ovarian cyst.  Also recommended patient to place heat to her lower back in the interim.  She will follow-up for her ultrasound and will go from there.  Greater than 50% of my time was spent in direct face to face counseling and coordination of care with the patient.     Dara Lordsimothy P Karlen Barbar MD, 9:03 AM 05/05/2018

## 2018-05-05 NOTE — Patient Instructions (Signed)
Follow up for ultrasound as scheduled 

## 2018-05-07 LAB — URINALYSIS, COMPLETE W/RFL CULTURE
Bacteria, UA: NONE SEEN /HPF
Bilirubin Urine: NEGATIVE
Hyaline Cast: NONE SEEN /LPF
Ketones, ur: NEGATIVE
Leukocyte Esterase: NEGATIVE
Nitrites, Initial: NEGATIVE
Protein, ur: NEGATIVE
Specific Gravity, Urine: 1.01 (ref 1.001–1.03)
pH: 5.5 (ref 5.0–8.0)

## 2018-05-07 LAB — CULTURE INDICATED

## 2018-05-07 LAB — URINE CULTURE
MICRO NUMBER: 90720468
SPECIMEN QUALITY: ADEQUATE

## 2018-05-08 NOTE — Telephone Encounter (Signed)
Left message for pt to call.

## 2018-05-09 ENCOUNTER — Ambulatory Visit: Payer: BLUE CROSS/BLUE SHIELD | Admitting: Women's Health

## 2018-05-09 NOTE — Telephone Encounter (Signed)
Pt informed with the below note. 

## 2018-05-16 ENCOUNTER — Ambulatory Visit: Payer: BLUE CROSS/BLUE SHIELD

## 2018-05-29 ENCOUNTER — Ambulatory Visit: Payer: BLUE CROSS/BLUE SHIELD

## 2018-05-30 ENCOUNTER — Ambulatory Visit: Payer: BLUE CROSS/BLUE SHIELD

## 2018-06-05 ENCOUNTER — Ambulatory Visit
Admission: RE | Admit: 2018-06-05 | Discharge: 2018-06-05 | Disposition: A | Discharge status: Home / Self Care | Payer: BLUE CROSS/BLUE SHIELD | Source: Ambulatory Visit | Attending: Neurology | Admitting: Neurology

## 2018-06-05 DIAGNOSIS — Z1231 Encounter for screening mammogram for malignant neoplasm of breast: Secondary | ICD-10-CM

## 2018-06-26 ENCOUNTER — Ambulatory Visit (INDEPENDENT_AMBULATORY_CARE_PROVIDER_SITE_OTHER): Payer: BLUE CROSS/BLUE SHIELD

## 2018-06-26 ENCOUNTER — Encounter: Payer: Self-pay | Admitting: Gynecology

## 2018-06-26 ENCOUNTER — Ambulatory Visit: Payer: BLUE CROSS/BLUE SHIELD | Admitting: Gynecology

## 2018-06-26 VITALS — BP 116/74

## 2018-06-26 DIAGNOSIS — R1032 Left lower quadrant pain: Secondary | ICD-10-CM | POA: Diagnosis not present

## 2018-06-26 DIAGNOSIS — R109 Unspecified abdominal pain: Secondary | ICD-10-CM

## 2018-06-26 NOTE — Progress Notes (Addendum)
    Ivor CostaBong T Yaklin 06/20/1951 [id number]        67 y.o.  G2P2 presents for follow-up ultrasound.  History of left sided pain starting in the back and radiating to the front.  Patient notes though that it seems to occur at night when she is lying down.  When she is up and moving around and active she is not having any pain.  Past medical history,surgical history, problem list, medications, allergies, family history and social history were all reviewed and documented in the EPIC chart.  Directed ROS with pertinent positives and negatives documented in the history of present illness/assessment and plan.  Exam: Vitals:   06/26/18 1152  BP: 116/74   General appearance:  Normal  Ultrasound transvaginal and transabdominal shows uterus retroverted normal size and echotexture.  Calcified area consistent with myoma 36 x 27 x 32 mm.  No vascular flow to the area.  Right and left ovaries normal and atrophic.  Cul-de-sac negative.  Assessment/Plan:  67 y.o. G2P2 with left-sided pain that sounds more orthopedic.  Occurs primarily at night when she is sleeping although not consistently.  Reviewed ultrasound with the patient to include the small calcified myoma.  Recommended she follow-up with orthopedics if her discomfort continues to be a major issue.  Otherwise will follow expectantly.    Dara Lordsimothy P Amyre Segundo MD, 12:42 PM 06/26/2018

## 2018-06-26 NOTE — Patient Instructions (Signed)
Follow-up in 1 year for annual exam 

## 2018-07-07 ENCOUNTER — Other Ambulatory Visit: Payer: Self-pay | Admitting: Physician Assistant

## 2018-07-07 DIAGNOSIS — Z1231 Encounter for screening mammogram for malignant neoplasm of breast: Secondary | ICD-10-CM

## 2019-01-10 ENCOUNTER — Telehealth (HOSPITAL_COMMUNITY): Payer: Self-pay | Admitting: *Deleted

## 2019-01-10 NOTE — Telephone Encounter (Signed)
Received signed MD order from Dr. Boneta Lucks [facility]) for this pt to participate in Cardiac Rehab.  Pt is s/p 12/19/18 DES to RCA at [facility]. Clinical review of pt follow up appt on 2/11 with York Cerise PA with Dr. Boneta Lucks cardiologist office note. Pt is making the expected progress in recovery.  Pt appropriate for insurance benefits/eligibility and scheduling for cardiac rehab.  Will forward to support staff for scheduling with pt  consent. Alanson Aly, BSN Cardiac and Emergency planning/management officer

## 2019-01-16 ENCOUNTER — Telehealth (HOSPITAL_COMMUNITY): Payer: Self-pay

## 2019-01-16 NOTE — Telephone Encounter (Signed)
Pt insurance is active and benefits verified through [facility]. Co-pay $45.00, DED $0.00/$0.00 met, out of pocket $4,200.00/$225.00 met, co-insurance 0%. No pre-authorization required. Passport, 01/16/2019 @ 2:39PM, REF# [id number]

## 2019-01-16 NOTE — Telephone Encounter (Signed)
Attempted to contact pt in regards to CR and go over insurance benefits.  LMTCB

## 2019-01-16 NOTE — Telephone Encounter (Signed)
Pt came in and scheduled CR orientation, pt will come in for orientation on 02/13/2019 @ 1:30PM and will attend the 815AM class.  Gave pt homework package while she was here in the office.

## 2019-01-16 NOTE — Telephone Encounter (Signed)
Pt returned CR phone call went over insurance, patient verbalized understanding.

## 2019-02-05 ENCOUNTER — Telehealth (HOSPITAL_COMMUNITY): Payer: Self-pay

## 2019-02-05 NOTE — Telephone Encounter (Signed)
Called and left vm to tell pt that the department is closed the next 2 weeks

## 2019-02-13 ENCOUNTER — Ambulatory Visit (HOSPITAL_COMMUNITY): Payer: Medicare HMO

## 2019-02-19 ENCOUNTER — Ambulatory Visit (HOSPITAL_COMMUNITY): Payer: Medicare HMO

## 2019-02-21 ENCOUNTER — Ambulatory Visit (HOSPITAL_COMMUNITY): Payer: Medicare HMO

## 2019-02-23 ENCOUNTER — Ambulatory Visit (HOSPITAL_COMMUNITY): Payer: Medicare HMO

## 2019-02-26 ENCOUNTER — Ambulatory Visit (HOSPITAL_COMMUNITY): Payer: Medicare HMO

## 2019-02-28 ENCOUNTER — Ambulatory Visit (HOSPITAL_COMMUNITY): Payer: Medicare HMO

## 2019-03-02 ENCOUNTER — Ambulatory Visit (HOSPITAL_COMMUNITY): Payer: Medicare HMO

## 2019-03-05 ENCOUNTER — Ambulatory Visit (HOSPITAL_COMMUNITY): Payer: Medicare HMO

## 2019-03-07 ENCOUNTER — Ambulatory Visit (HOSPITAL_COMMUNITY): Payer: Medicare HMO

## 2019-03-09 ENCOUNTER — Ambulatory Visit (HOSPITAL_COMMUNITY): Payer: Medicare HMO

## 2019-03-12 ENCOUNTER — Ambulatory Visit (HOSPITAL_COMMUNITY): Payer: Medicare HMO

## 2019-03-14 ENCOUNTER — Ambulatory Visit (HOSPITAL_COMMUNITY): Payer: Medicare HMO

## 2019-03-16 ENCOUNTER — Ambulatory Visit (HOSPITAL_COMMUNITY): Payer: Medicare HMO

## 2019-03-19 ENCOUNTER — Ambulatory Visit (HOSPITAL_COMMUNITY): Payer: Medicare HMO

## 2019-03-21 ENCOUNTER — Ambulatory Visit (HOSPITAL_COMMUNITY): Payer: Medicare HMO

## 2019-03-22 ENCOUNTER — Telehealth (HOSPITAL_COMMUNITY): Payer: Self-pay

## 2019-03-23 ENCOUNTER — Ambulatory Visit (HOSPITAL_COMMUNITY): Payer: Medicare HMO

## 2019-03-26 ENCOUNTER — Ambulatory Visit (HOSPITAL_COMMUNITY): Payer: Medicare HMO

## 2019-03-28 ENCOUNTER — Ambulatory Visit (HOSPITAL_COMMUNITY): Payer: Medicare HMO

## 2019-03-30 ENCOUNTER — Ambulatory Visit (HOSPITAL_COMMUNITY): Payer: Medicare HMO

## 2019-04-02 ENCOUNTER — Ambulatory Visit (HOSPITAL_COMMUNITY): Payer: Medicare HMO

## 2019-04-04 ENCOUNTER — Ambulatory Visit (HOSPITAL_COMMUNITY): Payer: Medicare HMO

## 2019-04-06 ENCOUNTER — Ambulatory Visit (HOSPITAL_COMMUNITY): Payer: Medicare HMO

## 2019-04-09 ENCOUNTER — Telehealth (HOSPITAL_COMMUNITY): Payer: Self-pay | Admitting: *Deleted

## 2019-04-09 ENCOUNTER — Ambulatory Visit (HOSPITAL_COMMUNITY): Payer: Medicare HMO

## 2019-04-09 NOTE — Telephone Encounter (Signed)
Contacted patient to follow-up on Cardiac Rehab referral and advised patient that department remains closed at this time due to COVID-19 pandemic. Patient would like to reschedule orientation once the program reopens. Pt was doing some walking at home or using home treadmill, 1-2 days/week until she hurt her ankle about a week ago. Pt states she's been icing and putting heat on ankle, and it's feeling better. Pt plans to resume walking once ankle feels better. Will mail patient packet with warm-up/cool-down stretches and nutrition handout.   Artist Pais, MS, ACSM CEP [phone]

## 2019-04-11 ENCOUNTER — Ambulatory Visit (HOSPITAL_COMMUNITY): Payer: Medicare HMO

## 2019-04-13 ENCOUNTER — Ambulatory Visit (HOSPITAL_COMMUNITY): Payer: Medicare HMO

## 2019-04-18 ENCOUNTER — Ambulatory Visit (HOSPITAL_COMMUNITY): Payer: Medicare HMO

## 2019-04-20 ENCOUNTER — Ambulatory Visit (HOSPITAL_COMMUNITY): Payer: Medicare HMO

## 2019-04-23 ENCOUNTER — Ambulatory Visit (HOSPITAL_COMMUNITY): Payer: Medicare HMO

## 2019-04-25 ENCOUNTER — Ambulatory Visit (HOSPITAL_COMMUNITY): Payer: Medicare HMO

## 2019-04-25 ENCOUNTER — Telehealth (HOSPITAL_COMMUNITY): Payer: Self-pay | Admitting: *Deleted

## 2019-04-25 NOTE — Telephone Encounter (Signed)
Called pt to inquire about signing up for virtual cardiac rehab while department remains closed.  Pt declined at this time and would like to be contacted when the department opens back up. Pt has received educational handouts in the mail and does not request any at this time.  She has been able to walk and use a treadmill for exercise. Will follow up with pt when department reopens.

## 2019-04-27 ENCOUNTER — Ambulatory Visit (HOSPITAL_COMMUNITY): Payer: Medicare HMO

## 2019-04-30 ENCOUNTER — Ambulatory Visit (HOSPITAL_COMMUNITY): Payer: Medicare HMO

## 2019-05-01 ENCOUNTER — Encounter: Payer: BLUE CROSS/BLUE SHIELD | Admitting: Gynecology

## 2019-05-02 ENCOUNTER — Ambulatory Visit (HOSPITAL_COMMUNITY): Payer: Medicare HMO

## 2019-05-03 ENCOUNTER — Other Ambulatory Visit: Payer: Self-pay | Admitting: Physician Assistant

## 2019-05-03 DIAGNOSIS — Z1231 Encounter for screening mammogram for malignant neoplasm of breast: Secondary | ICD-10-CM

## 2019-05-04 ENCOUNTER — Ambulatory Visit (HOSPITAL_COMMUNITY): Payer: Medicare HMO

## 2019-05-07 ENCOUNTER — Ambulatory Visit (HOSPITAL_COMMUNITY): Payer: Medicare HMO

## 2019-05-09 ENCOUNTER — Ambulatory Visit (HOSPITAL_COMMUNITY): Payer: Medicare HMO

## 2019-05-11 ENCOUNTER — Ambulatory Visit (HOSPITAL_COMMUNITY): Payer: Medicare HMO

## 2019-05-14 ENCOUNTER — Ambulatory Visit (HOSPITAL_COMMUNITY): Payer: Medicare HMO

## 2019-05-16 ENCOUNTER — Ambulatory Visit (HOSPITAL_COMMUNITY): Payer: Medicare HMO

## 2019-05-18 ENCOUNTER — Ambulatory Visit (HOSPITAL_COMMUNITY): Payer: Medicare HMO

## 2019-05-21 ENCOUNTER — Telehealth (HOSPITAL_COMMUNITY): Payer: Self-pay

## 2019-05-21 ENCOUNTER — Other Ambulatory Visit: Payer: Self-pay

## 2019-05-21 ENCOUNTER — Ambulatory Visit (HOSPITAL_COMMUNITY): Payer: Medicare HMO

## 2019-05-21 NOTE — Telephone Encounter (Signed)
Pt insurance is active and benefits verified through [facility]. Co-pay $25.00, DED $0.00/$0.00 met, out of pocket $4,200.00/$366.74 met, co-insurance 0%. No pre-authorization required. Passport, 05/21/2019 @ 12:28PM, CYO#[id number]

## 2019-05-22 ENCOUNTER — Encounter: Payer: Self-pay | Admitting: Gynecology

## 2019-05-22 ENCOUNTER — Ambulatory Visit: Payer: Medicare HMO | Admitting: Gynecology

## 2019-05-22 VITALS — BP 118/76 | Ht 62.0 in | Wt 125.0 lb

## 2019-05-22 DIAGNOSIS — M8580 Other specified disorders of bone density and structure, unspecified site: Secondary | ICD-10-CM

## 2019-05-22 DIAGNOSIS — Z01419 Encounter for gynecological examination (general) (routine) without abnormal findings: Secondary | ICD-10-CM | POA: Diagnosis not present

## 2019-05-22 DIAGNOSIS — N952 Postmenopausal atrophic vaginitis: Secondary | ICD-10-CM

## 2019-05-22 NOTE — Addendum Note (Signed)
Addended by: Joaquin Music on: 05/22/2019 11:00 AM   Modules accepted: Orders

## 2019-05-22 NOTE — Addendum Note (Signed)
Addended by: Nelva Nay on: 05/22/2019 09:06 AM   Modules accepted: Orders

## 2019-05-22 NOTE — Patient Instructions (Signed)
Follow-up in 1 year for annual exam  Schedule and follow-up for your mammogram

## 2019-05-22 NOTE — Progress Notes (Signed)
    [name] February 20, 1951 [id number]        68 y.o.  G2P2 for breast and pelvic exam.  Without gynecologic complaints.  Past medical history,surgical history, problem list, medications, allergies, family history and social history were all reviewed and documented as reviewed in the EPIC chart.  ROS:  Performed with pertinent positives and negatives included in the history, assessment and plan.   Additional significant findings : None   Exam: Caryn Bee assistant Vitals:   05/22/19 0807  BP: 118/76  Weight: 125 lb (56.7 kg)  Height: 5\' 2"  (1.575 m)   Body mass index is 22.86 kg/m.  General appearance:  Normal affect, orientation and appearance. Skin: Grossly normal HEENT: Without gross lesions.  No cervical or supraclavicular adenopathy. Thyroid normal.  Lungs:  Clear without wheezing, rales or rhonchi Cardiac: RR, without RMG Abdominal:  Soft, nontender, without masses, guarding, rebound, organomegaly or hernia Breasts:  Examined lying and sitting without masses, retractions, discharge or axillary adenopathy. Pelvic:  Ext, BUS, Vagina: With atrophic changes  Cervix: With atrophic changes  Uterus: Anteverted, normal size, shape and contour, midline and mobile nontender   Adnexa: Without masses or tenderness    Anus and perineum: Normal   Rectovaginal: Normal sphincter tone without palpated masses or tenderness.    Assessment/Plan:  68 y.o. G2P2 female for breast and pelvic exam.  1. Postmenopausal/atrophic genital changes.  No significant menopausal symptoms or any vaginal bleeding. 2. Mammography in the process of being arranged.  Breast exam normal today. 3. Pap smear 2019.  No history of abnormal Pap smears.  No Pap smear done today.  Options to stop screening per current screening guidelines based on age versus less frequent screening intervals reviewed.  Will readdress on annual basis. 4. Colonoscopy 2019.  Repeat at their recommended interval. 5. Osteopenia.   DEXA 2019 T score -2.2 FRAX 11% / 1.9%.  Reports taking vitamin D daily.  Check vitamin D level today.  Plan repeat DEXA next year at 2-year interval. 6. Health maintenance.  No routine lab work done as patient does this elsewhere.  Follow-up 1 year, sooner as needed.   Anastasio Auerbach MD, 8:59 AM 05/22/2019

## 2019-05-23 ENCOUNTER — Ambulatory Visit (HOSPITAL_COMMUNITY): Payer: Medicare HMO

## 2019-05-23 LAB — VITAMIN D 25 HYDROXY (VIT D DEFICIENCY, FRACTURES): Vit D, 25-Hydroxy: 42 ng/mL (ref 30–100)

## 2019-06-20 ENCOUNTER — Ambulatory Visit
Admission: RE | Admit: 2019-06-20 | Discharge: 2019-06-20 | Disposition: A | Discharge status: Home / Self Care | Payer: Medicare HMO | Source: Ambulatory Visit | Attending: Physician Assistant | Admitting: Physician Assistant

## 2019-06-20 ENCOUNTER — Other Ambulatory Visit: Payer: Self-pay

## 2019-06-20 ENCOUNTER — Telehealth: Payer: Self-pay

## 2019-06-20 DIAGNOSIS — Z1231 Encounter for screening mammogram for malignant neoplasm of breast: Secondary | ICD-10-CM

## 2019-06-20 NOTE — Telephone Encounter (Signed)
Patient's husband called in voice mail about Vit D level result. I called patient back at his request and I used [place] interpretor through [employer].  Patient was informed Vit D level was normal.

## 2019-07-04 ENCOUNTER — Encounter (HOSPITAL_COMMUNITY): Payer: Self-pay

## 2019-08-29 ENCOUNTER — Encounter: Payer: Self-pay | Admitting: Gynecology

## 2020-09-10 IMAGING — MG DIGITAL SCREENING BILATERAL MAMMOGRAM WITH TOMO AND CAD
6 of 10 series · 6 of 30 positions shown · non-contrast
Comparison: Previous exam(s).

CLINICAL DATA: Screening.

EXAM:
DIGITAL SCREENING BILATERAL MAMMOGRAM WITH TOMO AND CAD

[L MLO synth-2D]
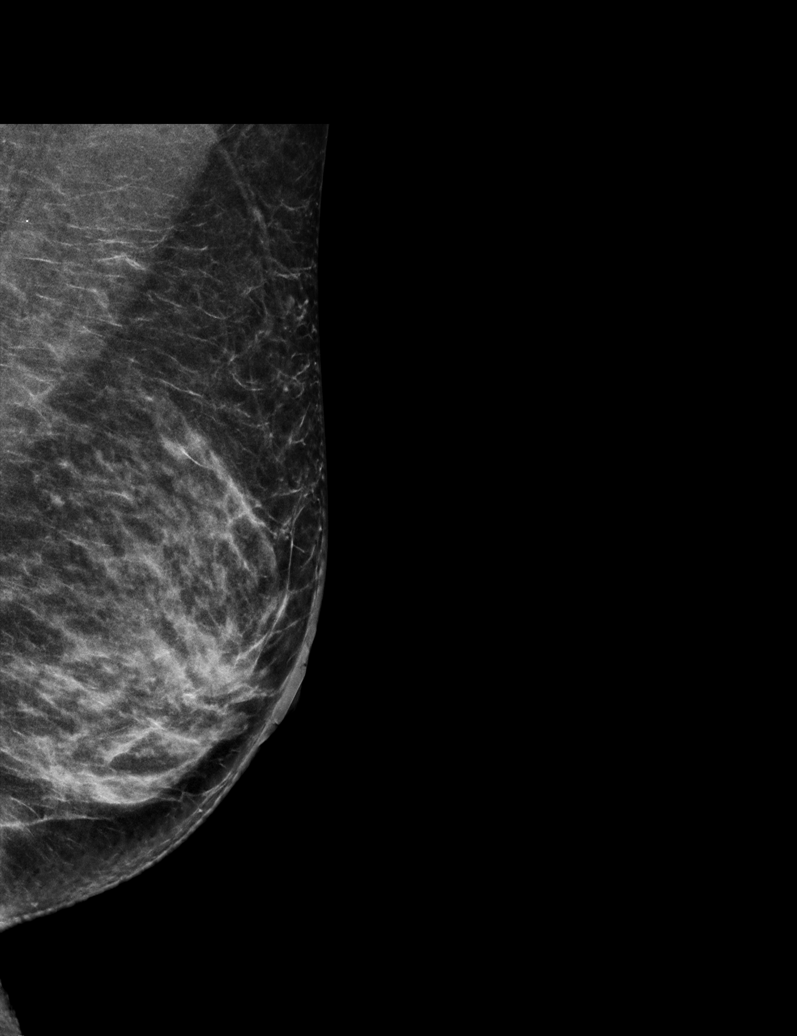

[R CC synth-2D]
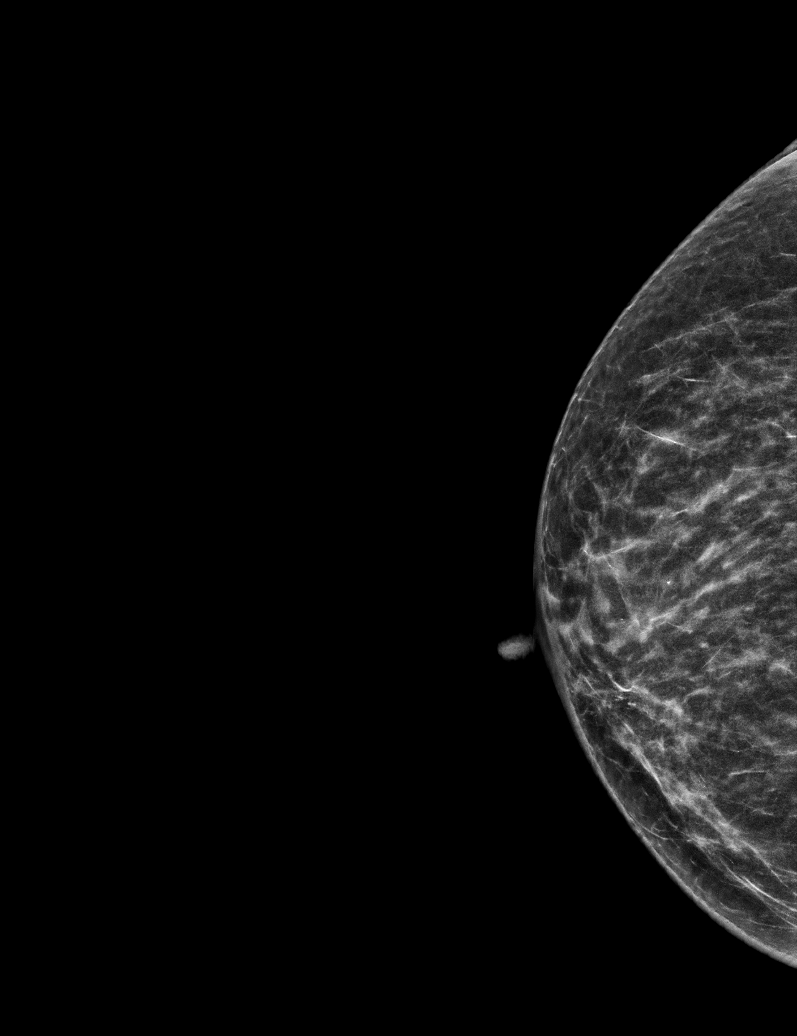

[L CC synth-2D]
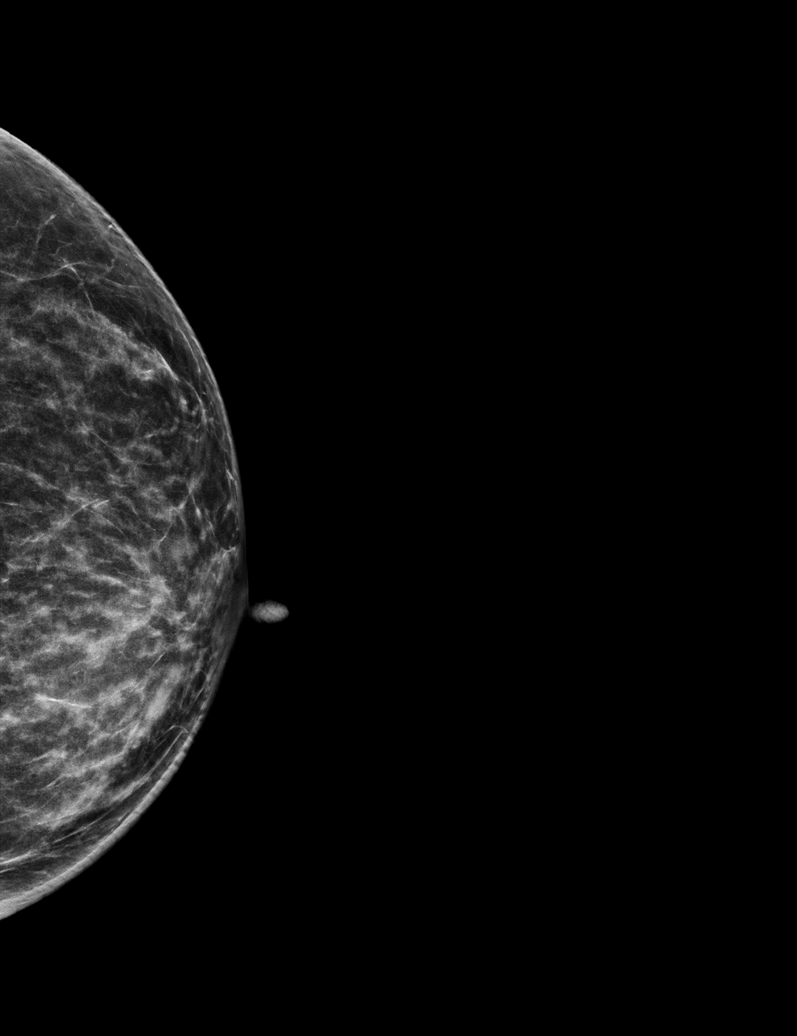

[R MLO synth-2D (1 of 2)]
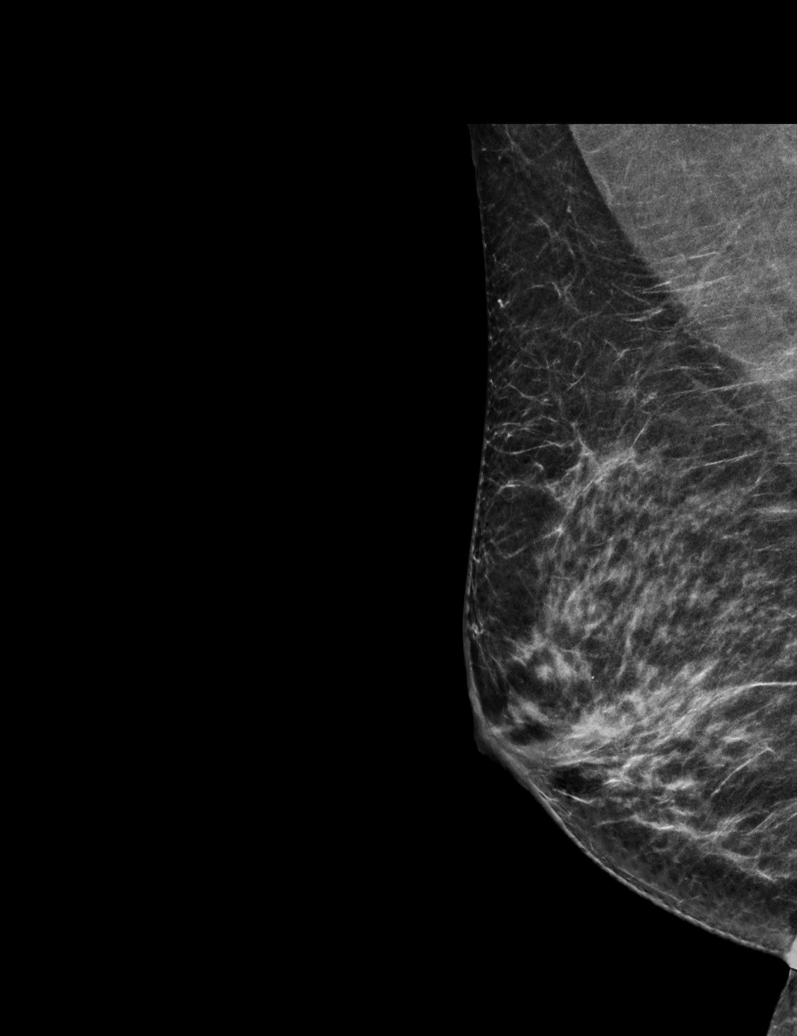

[R MLO synth-2D (2 of 2)]
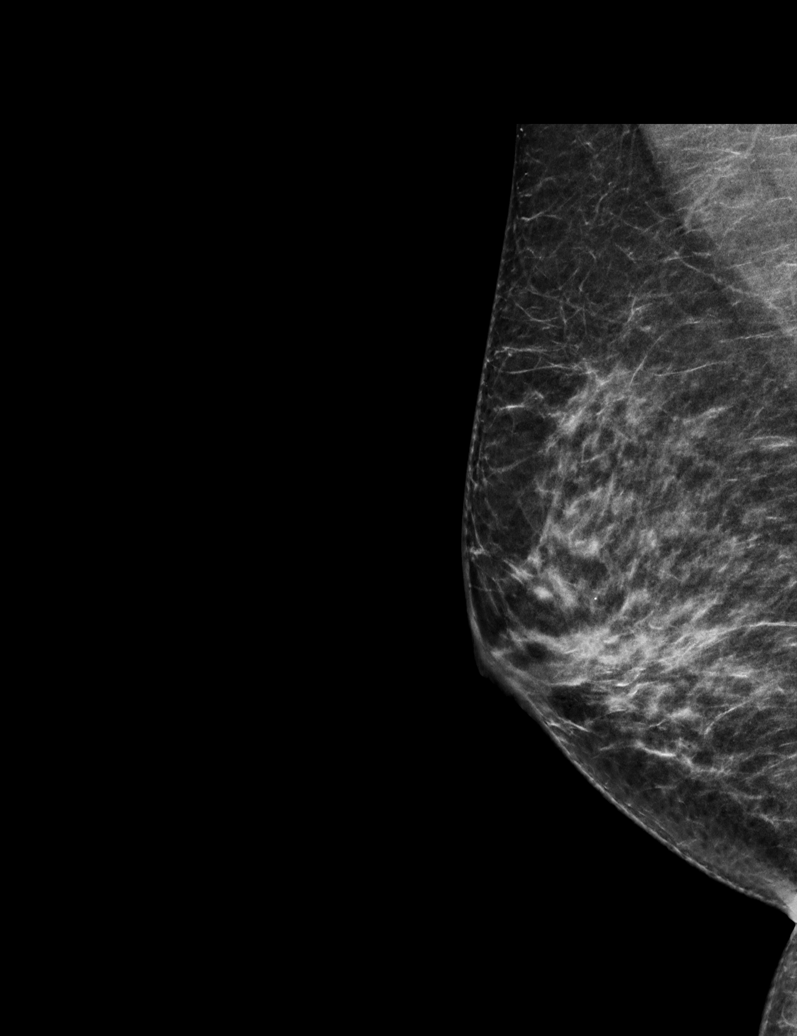

[R MLO tomo · tomo slice 28/55.0]
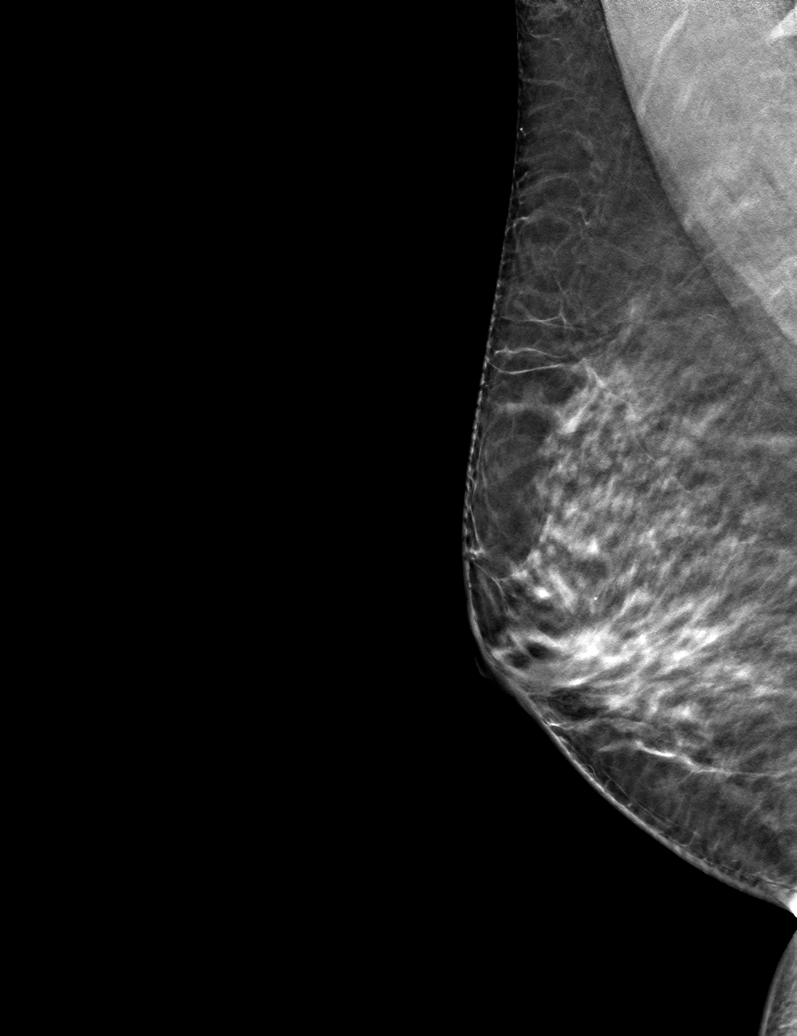

[6 of 30 positions shown; findings below may reference images not displayed]

ACR Breast Density Category c: The breast tissue is heterogeneously
dense, which may obscure small masses.
FINDINGS: There are no findings suspicious for malignancy. Images were
processed with CAD.
IMPRESSION: No mammographic evidence of malignancy. A result letter of this
screening mammogram will be mailed directly to the patient.

RECOMMENDATION:
Screening mammogram in one year. (Code:[id number])

BI-RADS CATEGORY  1: Negative.

## 2020-09-15 ENCOUNTER — Ambulatory Visit: Payer: Medicare HMO | Attending: Internal Medicine

## 2020-09-15 DIAGNOSIS — Z23 Encounter for immunization: Secondary | ICD-10-CM

## 2020-09-15 NOTE — Progress Notes (Signed)
   Covid-19 Vaccination Clinic  Name:  [name]    MRN: [id number] DOB: [birth year]  09/15/2020  [name] was observed post Covid-19 immunization for 15 minutes without incident. She was provided with Vaccine Information Sheet and instruction to access the V-Safe system.   [name] was instructed to call 911 with any severe reactions post vaccine: Marland Kitchen Difficulty breathing  . Swelling of face and throat  . A fast heartbeat  . A bad rash all over body  . Dizziness and weakness

## 2021-08-11 ENCOUNTER — Ambulatory Visit: Payer: Self-pay

## 2021-08-11 ENCOUNTER — Other Ambulatory Visit: Payer: Self-pay

## 2021-08-11 ENCOUNTER — Ambulatory Visit: Payer: Medicare HMO | Admitting: Sports Medicine

## 2021-08-11 ENCOUNTER — Ambulatory Visit (INDEPENDENT_AMBULATORY_CARE_PROVIDER_SITE_OTHER): Payer: Medicare HMO

## 2021-08-11 VITALS — BP 120/78 | HR 68 | Ht 62.0 in

## 2021-08-11 DIAGNOSIS — M25562 Pain in left knee: Secondary | ICD-10-CM

## 2021-08-11 DIAGNOSIS — S83412A Sprain of medial collateral ligament of left knee, initial encounter: Secondary | ICD-10-CM

## 2021-08-11 MED ORDER — MELOXICAM 7.5 MG PO TABS: 7.5000 mg | ORAL_TABLET | Freq: Every day | ORAL | 0 refills | Status: DC

## 2021-08-11 NOTE — Progress Notes (Signed)
[name] D.[name] [address] [place] [address] Phone: [phone]   Assessment and Plan:   I, Philbert Riser, PhD, LAT, ATC, am acting as a Neurosurgeon for [employer], DO, CAQSM.   1. Left knee pain, unspecified chronicity 2. Sprain of medial collateral ligament of left knee, initial encounter - Acute with uncertain prognosis, initial sports medicine visit - Left medial knee contusion versus MCL sprain versus MCL tear based on HPI, physical exam, unremarkable x-ray, ultrasound - Difficult determine if sprain versus MCL tear on ultrasound with medial sided edema and patient tenderness - Start using crutches and hinged knee brace with partial weightbearing as tolerated. - Start meloxicam 7.5 mg daily for 2 weeks - RICE therapy - xray obtained in clinic. My interpretation: no acute fracture, no dislocation, no significant cortical irregularities - DG Knee AP/LAT W/Sunrise Left; Future - [place] LIMITED JOINT SPACE STRUCTURES LOW LEFT(NO LINKED CHARGES); Future    Pertinent previous records reviewed include none   Follow Up: In 2 weeks for reevaluation.  Plan to repeat ultrasound and physical exam to determine extent of MCL injury   Subjective:    Chief Complaint: L knee pain   08/11/21 HPI: Pt is a 69 y/o female c/o L knee pain ongoing since last night. MOI: Pt fell down the stairs, landing and sliding down the remaining stairs on her L knee. Pt locates pain to the anterior-medial aspect of her L knee. Pt c/o pain being so bad she is unable to walk.  L knee swelling: yes Mechanical symptoms: no Aggravates: walking, standing Treatments tried: Bengay    Relevant Historical Information: Hypertension, DM type II  Additional pertinent review of systems negative.   Current Outpatient Medications:    meloxicam (MOBIC) 7.5 MG tablet, Take 1 tablet (7.5 mg total) by mouth daily for 14 days., Disp: 14 tablet, Rfl: 0   amLODipine  (NORVASC) 10 MG tablet, Take 10 mg by mouth daily., Disp: , Rfl:    atorvastatin (LIPITOR) 10 MG tablet, Take 10 mg by mouth daily., Disp: , Rfl:    canagliflozin (INVOKANA) 100 MG TABS tablet, TAKE ONE TABLET BY MOUTH ONCE DAILY, Disp: , Rfl:    cholecalciferol (VITAMIN D) 1000 UNITS tablet, Take 1,000 Units by mouth daily., Disp: , Rfl:    fish oil-omega-3 fatty acids 1000 MG capsule, Take 2 g by mouth daily., Disp: , Rfl:    gemfibrozil (LOPID) 600 MG tablet, Take 600 mg by mouth 2 (two) times daily before a meal. , Disp: , Rfl:    Glucose Blood (BLOOD GLUCOSE TEST STRIPS) STRP, Check fasting glucose readings once daily OneTouch please, Disp: , Rfl:    Lifitegrast (XIIDRA) 5 % SOLN, , Disp: , Rfl:    lisinopril-hydrochlorothiazide (PRINZIDE,ZESTORETIC) 20-12.5 MG per tablet, Take 1 tablet by mouth daily., Disp: , Rfl:    lovastatin (MEVACOR) 40 MG tablet, , Disp: , Rfl:    metFORMIN (GLUCOPHAGE) 500 MG tablet, Take by mouth 2 (two) times daily with a meal., Disp: , Rfl:    Multiple Vitamin (MULTIVITAMIN) tablet, Take 1 tablet by mouth daily., Disp: , Rfl:    NITROSTAT 0.3 MG SL tablet, Place 0.3 mg under the tongue every 5 (five) minutes as needed. , Disp: , Rfl:    omeprazole (PRILOSEC) 40 MG capsule, Take 40 mg by mouth daily., Disp: , Rfl:    omeprazole (PRILOSEC) 40 MG capsule, TAKE ONE CAPSULE BY MOUTH ONCE DAILY, Disp: , Rfl:  Objective:     Vitals:   08/11/21 1024  BP: 120/78  Pulse: 68  SpO2: 98%  Height: 5\' 2"  (1.575 m)      Body mass index is 22.86 kg/m.    Physical Exam:    General:  awake, alert oriented, no acute distress nontoxic Skin: no suspicious lesions or rashes Neuro:sensation intact, no deficits, strength 5/5 with no deficits, no atrophy, normal muscle tone Psych: No signs of anxiety, depression or other mood disorder  Left knee: Moderate pouch of swelling on medial side of knee No deformity Neg fluid wave, joint milking ROM Flex 90, Ext 10 TTP at  area of swelling over medial sided knee NTTP over the quad tendon, medial fem condyle, lat fem condyle, patella, plica, patella tendon, tibial tuberostiy, fibular head, posterior fossa, pes anserine bursa, gerdy's tubercle, medial jt line, lateral jt line Neg anterior and posterior drawer Neg lachman Neg sag sign Negative varus stress Positive valgus stress Negative McMurray  Gait abnormal.  Unable to bear weight due to medial sided knee pain  Sports Medicine: Musculoskeletal Ultrasound. Exam: Limited of left knee Diagnosis: Left knee pain and swelling  US Findings: No intra-articular effusion seen.  No gross cortical irregularities.  Moderate soft tissue edema along medial side of knee obscuring clear view of MCL  US Impression:  Soft tissue edema. 2.  MCL sprain   Electronically signed by:  [place] D.[name] 12:21 PM 08/11/21

## 2021-08-11 NOTE — Patient Instructions (Addendum)
Good to see you today.  Use crutches to help get around until walking isn't painful  Use knee brace when you are up and moving around  Take Meloxicam 7.5mg  daily for 2 wks  Ice 20 minutes 2 times daily. Usually after activity and before bed.   See me again in 2 weeks   Combined Knee Ligament Sprain A ligament is a tough band of tissue that connects one bone to another bone. There are four ligaments in your knee. Together, they provide stability for your knee joint. A combined knee ligament sprain is an injury that happens when more than one knee ligament is severely stretched or torn. This kind of injury is also called an injury to multiple structures of the knee. What are the causes? This condition may be caused by: A direct hit (trauma) to the knee. Overextending the knee. Twisting the knee. What increases the risk? You are more likely to develop this condition if you participate in certain sports, including: Contact sports, such as football, rugby, and lacrosse. Sports that take place on uneven ground, such as soccer and cross country. Sports that involve quick changes in position, such as basketball, dancing, gymnastics, and skiing. You are also more likely to develop this condition if you: Have poor strength or flexibility. Are overweight. Have overly flexible joints (joint laxity). Have previously injured your knee or had surgery on your knee. What are the signs or symptoms? Common symptoms of this condition include: Swelling. Severe pain with movement. Pain when the injured area is touched. Instability. A popping sound that happens at the time of injury. Not being able to stand or use the injured knee to support (bear) one's body weight. How is this diagnosed? This condition is diagnosed with a physical exam. You may also have imaging tests, such as: X-ray. MRI. How is this treated? Treatment depends on how badly the ligaments are injured and may include: Ice  applied to the affected area. Medicines for pain. Placing the knee in a brace or splint to prevent movement and support the joint. Physical therapy to help strengthen and stabilize the knee joint. Surgery to reconstruct a torn ligament. This may be done in severe cases. Follow these instructions at home: If you have a splint or brace: Wear the splint or brace as told by your health care provider. Remove it only as told by your health care provider. Loosen the splint or brace if your toes tingle, become numb, or turn cold and blue. Keep the splint or brace clean. If the splint or brace is not waterproof: Do not let it get wet. Cover it with a watertight covering when you take a bath or shower. Managing pain, stiffness, and swelling  If directed, put ice on the injured area. If you have a removable splint or brace, remove it as told by your health care provider. Put ice in a plastic bag. Place a towel between your skin and the bag. Leave the ice on for 20 minutes, 2-3 times a day. Move your toes often to reduce stiffness and swelling. Raise (elevate) the injured area above the level of your heart while you are sitting or lying down. Medicines Take over-the-counter and prescription medicines only as told by your health care provider. Ask your health care provider if the medicine prescribed to you: Requires you to avoid driving or using heavy machinery. Can cause constipation. You may need to take actions to prevent or treat constipation, such as: Drink enough fluid to keep your  urine pale yellow. Take over-the-counter or prescription medicines. Eat foods that are high in fiber, such as beans, whole grains, and fresh fruits and vegetables. Limit foods that are high in fat and processed sugars, such as fried or sweet foods. Activity Ask your health care provider when it is safe to drive if you have a splint or brace on your knee. Return to your normal activities as told by your health  care provider. Ask your health care provider what activities are safe for you. Perform exercises daily as told by your health care provider or physical therapist. Do not use the injured limb to support your body weight until your health care provider says that you can. Use crutches as told by your health care provider. General instructions Do not take baths, swim, or use a hot tub until your health care provider approves. Ask your health care provider if you may take showers. You may only be allowed to take sponge baths. Do not use any products that contain nicotine or tobacco, such as cigarettes, e-cigarettes, and chewing tobacco. These can delay healing. If you need help quitting, ask your health care provider. Keep all follow-up visits as told by your health care provider. This is important. Contact a health care provider if: Your symptoms do not improve. Your symptoms get worse. You develop tingling or numbness in the area of your injury. Get help right away if: You develop severe numbness or tingling in your leg or foot. Your foot turns blue, white, or gray, and it feels cold. Summary A combined knee ligament sprain is an injury that happens when more than one knee ligament is severely stretched or torn. Follow instructions for the care of your knee, including rest and activity, as told by your health care provider. Do not use the injured limb to support your body weight until your health care provider says that you can. Use crutches as told by your health care provider. Contact a health care provider if your symptoms do not improve or get worse. Keep all follow-up visits as told by your health care provider. This is important. This information is not intended to replace advice given to you by your health care provider. Make sure you discuss any questions you have with your health care provider. Document Revised: 02/28/2019 Document Reviewed: 06/28/2018 Elsevier Patient Education  2022  [employer].

## 2021-08-24 ENCOUNTER — Ambulatory Visit: Payer: Self-pay

## 2021-08-24 ENCOUNTER — Ambulatory Visit: Payer: Medicare HMO | Admitting: Sports Medicine

## 2021-08-24 ENCOUNTER — Other Ambulatory Visit: Payer: Self-pay

## 2021-08-24 VITALS — BP 118/80 | HR 65 | Ht 62.0 in | Wt 126.0 lb

## 2021-08-24 DIAGNOSIS — M25562 Pain in left knee: Secondary | ICD-10-CM | POA: Diagnosis not present

## 2021-08-24 DIAGNOSIS — T148XXA Other injury of unspecified body region, initial encounter: Secondary | ICD-10-CM | POA: Diagnosis not present

## 2021-08-24 MED ORDER — MELOXICAM 7.5 MG PO TABS: 7.5000 mg | ORAL_TABLET | Freq: Every day | ORAL | 0 refills | Status: AC

## 2021-08-24 NOTE — Progress Notes (Signed)
[name] D.[name] [address] [place] [address] Phone: [phone]   Assessment and Plan:     1. Acute pain of left knee 2. Contusion of bone -Acute, improving, subsequent sports medicine visit - Patient's pain consistent with bony contusion of distal medial femur based on HPI, physical exam, ultrasound - We will refill meloxicam 7.5 mg daily x2 weeks - Start knee HEP.  Handout provided - May use knee brace as needed   Pertinent previous records reviewed include previous office note   Follow Up: As needed if no improvement or worsening symptoms in 3 to 4 weeks.  Would consider repeat ultrasound versus advanced imaging, versus formal PT at that time   Subjective:   I, [name], am serving as a scribe for Dr. Richardean Higgins  Chief Complaint: Left knee pain follow up   HPI:     08/11/21 HPI: Pt is a 70 y/o female c/o L knee pain ongoing since last night. MOI: Pt fell down the stairs, landing and sliding down the remaining stairs on her L knee. Pt locates pain to the anterior-medial aspect of her L knee. Pt c/o pain being so bad she is unable to walk.   L knee swelling: yes Mechanical symptoms: no Aggravates: walking, standing Treatments tried: Bengay  08/24/21 Patient states that her knee is better but still sore. Still has pain going from seated position to standing and has medial knee pain when walking. Patient is still wearing the brace and the meloxicam did help with the pain   Relevant Historical Information: None pertinent  Additional pertinent review of systems negative.   Current Outpatient Medications:    amLODipine (NORVASC) 10 MG tablet, Take 10 mg by mouth daily., Disp: , Rfl:    atorvastatin (LIPITOR) 10 MG tablet, Take 10 mg by mouth daily., Disp: , Rfl:    canagliflozin (INVOKANA) 100 MG TABS tablet, TAKE ONE TABLET BY MOUTH ONCE DAILY, Disp: , Rfl:    cholecalciferol (VITAMIN D) 1000 UNITS  tablet, Take 1,000 Units by mouth daily., Disp: , Rfl:    fish oil-omega-3 fatty acids 1000 MG capsule, Take 2 g by mouth daily., Disp: , Rfl:    gemfibrozil (LOPID) 600 MG tablet, Take 600 mg by mouth 2 (two) times daily before a meal. , Disp: , Rfl:    Glucose Blood (BLOOD GLUCOSE TEST STRIPS) STRP, Check fasting glucose readings once daily OneTouch please, Disp: , Rfl:    Lifitegrast 5 % SOLN, , Disp: , Rfl:    lisinopril-hydrochlorothiazide (PRINZIDE,ZESTORETIC) 20-12.5 MG per tablet, Take 1 tablet by mouth daily., Disp: , Rfl:    lovastatin (MEVACOR) 40 MG tablet, , Disp: , Rfl:    metFORMIN (GLUCOPHAGE) 500 MG tablet, Take by mouth 2 (two) times daily with a meal., Disp: , Rfl:    Multiple Vitamin (MULTIVITAMIN) tablet, Take 1 tablet by mouth daily., Disp: , Rfl:    NITROSTAT 0.3 MG SL tablet, Place 0.3 mg under the tongue every 5 (five) minutes as needed. , Disp: , Rfl:    omeprazole (PRILOSEC) 40 MG capsule, Take 40 mg by mouth daily., Disp: , Rfl:    omeprazole (PRILOSEC) 40 MG capsule, TAKE ONE CAPSULE BY MOUTH ONCE DAILY, Disp: , Rfl:    meloxicam (MOBIC) 7.5 MG tablet, Take 1 tablet (7.5 mg total) by mouth daily for 14 days., Disp: 14 tablet, Rfl: 0   Objective:     Vitals:   08/24/21 0902  BP:  118/80  Pulse: 65  SpO2: 98%  Weight: 126 lb (57.2 kg)  Height: 5\' 2"  (1.575 m)      Body mass index is 23.05 kg/m.    Physical Exam:    General:  awake, alert oriented, no acute distress nontoxic Skin: no suspicious lesions or rashes Neuro:sensation intact, no deficits, strength 5/5 with no deficits, no atrophy, normal muscle tone Psych: No signs of anxiety, depression or other mood disorder  Left knee: No swelling No deformity Neg fluid wave, joint milking ROM Flex 110, Ext 0 TTP distal medial femur NTTP over the quad tendon,  lat fem condyle, patella, plica, patella tendon, tibial tuberostiy, fibular head, posterior fossa, pes anserine bursa, gerdy's tubercle, medial jt  line, lateral jt line Neg anterior and posterior drawer Neg lachman Neg sag sign Negative varus stress Mild pain with valgus stress   Gait normal   Sports Medicine: Musculoskeletal Ultrasound. Exam: Limited of left knee Diagnosis: Left knee pain  US Findings: MCL appears intact, no laxity with valgus stress.  No cortical irregularities.  Mild edema superficial to medial femoral condyle consistent with patient's contusion  US Impression:  Bony contusion of medial femoral condyle  Electronically signed by:  [place] D.[name] 9:22 AM 08/24/21

## 2021-08-24 NOTE — Patient Instructions (Addendum)
Good to see you  Continue the meloxicam for another week You can slowly start coming out of the brace Knee exercises given If not feeling better in 2-4 weeks make a follow up visit.

## 2021-09-09 ENCOUNTER — Telehealth: Payer: Self-pay | Admitting: Sports Medicine

## 2021-09-09 NOTE — Telephone Encounter (Signed)
Patient called stating that today will be the last day of Meloxicam. She said that the pain has gotten a little better but is still there. She asked if she should continue the meloxicam or what her next steps would be? Please advise.

## 2021-09-09 NOTE — Telephone Encounter (Signed)
Appointment scheduled.

## 2021-09-09 NOTE — Progress Notes (Signed)
[name] D.[name] [address] [place] [address] Phone: [phone]   Assessment and Plan:     1. Acute pain of left knee 2. Contusion of bone -Acute, improved, subsequent sports medicine visit - Significantly improved left knee pain with some residual bony pain at contusion site - Can return to full activities as tolerated - May use knee brace as needed - Discontinue meloxicam and start Tylenol as needed for pain control    Pertinent previous records reviewed include none   Follow Up: As needed   Subjective:   I, [name], am serving as a scribe for Dr. Richardean Sale  Chief Complaint: Left knee pain follow up   HPI:   08/11/21 HPI: Pt is a 70 y/o female c/o L knee pain ongoing since last night. MOI: Pt fell down the stairs, landing and sliding down the remaining stairs on her L knee. Pt locates pain to the anterior-medial aspect of her L knee. Pt c/o pain being so bad she is unable to walk.   L knee swelling: yes Mechanical symptoms: no Aggravates: walking, standing Treatments tried: Bengay   08/24/21 Patient states that her knee is better but still sore. Still has pain going from seated position to standing and has medial knee pain when walking. Patient is still wearing the brace and the meloxicam did help with the pain   09/10/21 Patient states that the knee is much better, but doing from a laying or sitting position she still will have the pain on the medial knee. Uses the bengay and heat. Patient states the meloxicam was still helping with that little bit of pain that she was having.   Relevant Historical Information: Hypertension, DM type II  Additional pertinent review of systems negative.   Current Outpatient Medications:    amLODipine (NORVASC) 10 MG tablet, Take 10 mg by mouth daily., Disp: , Rfl:    atorvastatin (LIPITOR) 10 MG tablet, Take 10 mg by mouth daily., Disp: , Rfl:    canagliflozin  (INVOKANA) 100 MG TABS tablet, TAKE ONE TABLET BY MOUTH ONCE DAILY, Disp: , Rfl:    cholecalciferol (VITAMIN D) 1000 UNITS tablet, Take 1,000 Units by mouth daily., Disp: , Rfl:    fish oil-omega-3 fatty acids 1000 MG capsule, Take 2 g by mouth daily., Disp: , Rfl:    gemfibrozil (LOPID) 600 MG tablet, Take 600 mg by mouth 2 (two) times daily before a meal. , Disp: , Rfl:    Glucose Blood (BLOOD GLUCOSE TEST STRIPS) STRP, Check fasting glucose readings once daily OneTouch please, Disp: , Rfl:    Lifitegrast 5 % SOLN, , Disp: , Rfl:    lisinopril-hydrochlorothiazide (PRINZIDE,ZESTORETIC) 20-12.5 MG per tablet, Take 1 tablet by mouth daily., Disp: , Rfl:    lovastatin (MEVACOR) 40 MG tablet, , Disp: , Rfl:    metFORMIN (GLUCOPHAGE) 500 MG tablet, Take by mouth 2 (two) times daily with a meal., Disp: , Rfl:    Multiple Vitamin (MULTIVITAMIN) tablet, Take 1 tablet by mouth daily., Disp: , Rfl:    NITROSTAT 0.3 MG SL tablet, Place 0.3 mg under the tongue every 5 (five) minutes as needed. , Disp: , Rfl:    omeprazole (PRILOSEC) 40 MG capsule, Take 40 mg by mouth daily., Disp: , Rfl:    omeprazole (PRILOSEC) 40 MG capsule, TAKE ONE CAPSULE BY MOUTH ONCE DAILY, Disp: , Rfl:    Objective:     Vitals:   09/10/21 0908  BP: 100/70  Pulse: 72  SpO2: 98%  Weight: 130 lb (59 kg)  Height: 5\' 2"  (1.575 m)      Body mass index is 23.78 kg/m.    Physical Exam:    General:  awake, alert oriented, no acute distress nontoxic Skin: no suspicious lesions or rashes Neuro:sensation intact, no deficits, strength 5/5 with no deficits, no atrophy, normal muscle tone Psych: No signs of anxiety, depression or other mood disorder  Knee: No swelling No deformity Neg fluid wave, joint milking ROM Flex 110, Ext 0 TTP distal medial femoral condyle NTTP over the quad tendon, lat fem condyle, patella, plica, patella tendon, tibial tuberostiy, fibular head, posterior fossa, pes anserine bursa, gerdy's tubercle,  medial jt line, lateral jt line Neg anterior and posterior drawer Neg lachman Neg sag sign Negative varus stress Negative valgus stress Negative McMurray Negative Thessaly  Gait normal    Electronically signed by:  D.[name] 9:25 AM 09/10/21

## 2021-09-10 ENCOUNTER — Ambulatory Visit: Payer: Medicare HMO | Admitting: Sports Medicine

## 2021-09-10 ENCOUNTER — Other Ambulatory Visit: Payer: Self-pay

## 2021-09-10 VITALS — BP 100/70 | HR 72 | Ht 62.0 in | Wt 130.0 lb

## 2021-09-10 DIAGNOSIS — T148XXA Other injury of unspecified body region, initial encounter: Secondary | ICD-10-CM

## 2021-09-10 DIAGNOSIS — M25562 Pain in left knee: Secondary | ICD-10-CM

## 2021-09-10 NOTE — Patient Instructions (Addendum)
Good to see you   Recommend stop taking meloxicam at this time.  May start Tylenol 500 mg 2 times a day for pain.  Recommend continuing this for the next 2 weeks or until pain resolves.  You may follow-up as needed.  See [place] again as needed

## 2021-09-15 ENCOUNTER — Other Ambulatory Visit: Payer: Self-pay

## 2021-09-15 ENCOUNTER — Ambulatory Visit: Payer: Medicare HMO | Admitting: Sports Medicine

## 2021-09-15 VITALS — BP 110/76 | HR 69 | Ht 62.0 in | Wt 129.0 lb

## 2021-09-15 DIAGNOSIS — S83412D Sprain of medial collateral ligament of left knee, subsequent encounter: Secondary | ICD-10-CM

## 2021-09-15 DIAGNOSIS — M25562 Pain in left knee: Secondary | ICD-10-CM

## 2021-09-15 MED ORDER — MELOXICAM 7.5 MG PO TABS: 7.5000 mg | ORAL_TABLET | Freq: Every day | ORAL | 0 refills | Status: AC

## 2021-09-15 NOTE — Patient Instructions (Addendum)
Meloxicam 14 days See me in 2 weeks

## 2021-09-15 NOTE — Progress Notes (Signed)
[name] D.[name] [address] [place] [address] Phone: [phone]   Assessment and Plan:    1. Acute pain of left knee 2. Left knee pain, unspecified chronicity 3. Sprain of medial collateral ligament of left knee, subsequent encounter -Subacute, recurrent, subsequent sports medicine visit - Patient had recurrence of medial sided left knee pain after discontinuing meloxicam.  No new MOI, so no additional imaging at this time - Will continue conservative therapy with restarting meloxicam 7.5 mg x 2 weeks, continue HEP for knee - Follow-up in 2 weeks at that time if patient continues to have pain, would order MRI left knee due to failure with conservative therapy for >6 weeks   Pertinent previous records reviewed include x-ray left knee, office note   Follow Up: - Follow-up in 2 weeks at that time if patient continues to have pain, would order MRI left knee due to failure with conservative therapy for >6 weeks     Subjective:     I, [name], am serving as a Neurosurgeon for Dr.  [name].  This visit occurred during the SARS-CoV-2 public health emergency.  Safety protocols were in place, including screening questions prior to the visit, additional usage of staff PPE, and extensive cleaning of exam room while observing appropriate contact time as indicated for disinfecting solutions.   Chief Complaint: left knee pain  HPI:   09/15/21 Patient states that medication helped to reduce her knee pain. Using Tylenol now and pain has come back. Pain with sit to stand over medial aspect.   Relevant Historical Information: None pertinent  Additional pertinent review of systems negative.   Current Outpatient Medications:    amLODipine (NORVASC) 10 MG tablet, Take 10 mg by mouth daily., Disp: , Rfl:    atorvastatin (LIPITOR) 10 MG tablet, Take 10 mg by mouth daily., Disp: , Rfl:    canagliflozin (INVOKANA) 100 MG TABS tablet, TAKE  ONE TABLET BY MOUTH ONCE DAILY, Disp: , Rfl:    cholecalciferol (VITAMIN D) 1000 UNITS tablet, Take 1,000 Units by mouth daily., Disp: , Rfl:    fish oil-omega-3 fatty acids 1000 MG capsule, Take 2 g by mouth daily., Disp: , Rfl:    gemfibrozil (LOPID) 600 MG tablet, Take 600 mg by mouth 2 (two) times daily before a meal. , Disp: , Rfl:    Glucose Blood (BLOOD GLUCOSE TEST STRIPS) STRP, Check fasting glucose readings once daily OneTouch please, Disp: , Rfl:    Lifitegrast 5 % SOLN, , Disp: , Rfl:    lisinopril-hydrochlorothiazide (PRINZIDE,ZESTORETIC) 20-12.5 MG per tablet, Take 1 tablet by mouth daily., Disp: , Rfl:    lovastatin (MEVACOR) 40 MG tablet, , Disp: , Rfl:    meloxicam (MOBIC) 7.5 MG tablet, Take 1 tablet (7.5 mg total) by mouth daily., Disp: 14 tablet, Rfl: 0   metFORMIN (GLUCOPHAGE) 500 MG tablet, Take by mouth 2 (two) times daily with a meal., Disp: , Rfl:    Multiple Vitamin (MULTIVITAMIN) tablet, Take 1 tablet by mouth daily., Disp: , Rfl:    NITROSTAT 0.3 MG SL tablet, Place 0.3 mg under the tongue every 5 (five) minutes as needed. , Disp: , Rfl:    omeprazole (PRILOSEC) 40 MG capsule, Take 40 mg by mouth daily., Disp: , Rfl:    omeprazole (PRILOSEC) 40 MG capsule, TAKE ONE CAPSULE BY MOUTH ONCE DAILY, Disp: , Rfl:    Objective:     Vitals:   09/15/21 1252  BP:  110/76  Pulse: 69  SpO2: 97%  Weight: 129 lb (58.5 kg)  Height: 5\' 2"  (1.575 m)      Body mass index is 23.59 kg/m.    Physical Exam:    General:  awake, alert oriented, no acute distress nontoxic Skin: no suspicious lesions or rashes Neuro:sensation intact, no deficits, strength 5/5 with no deficits, no atrophy, normal muscle tone Psych: No signs of anxiety, depression or other mood disorder  Left knee: No swelling No deformity Neg fluid wave, joint milking ROM Flex 110, Ext 0 TTP medial femoral condyle, medial joint line NTTP over the quad tendon, lat fem condyle, patella, plica, patella tendon,  tibial tuberostiy, fibular head, posterior fossa, pes anserine bursa, gerdy's tubercle, lateral jt line Neg anterior and posterior drawer Neg lachman Neg sag sign Negative varus stress Pain with valgus stress over medial aspect of knee, however no laxity Negative McMurray  Gait normal    Electronically signed by:  D.[name] 1:09 PM 09/15/21

## 2022-07-29 ENCOUNTER — Encounter: Payer: Self-pay | Admitting: Adult Health

## 2022-07-29 ENCOUNTER — Ambulatory Visit (INDEPENDENT_AMBULATORY_CARE_PROVIDER_SITE_OTHER): Payer: Medicare HMO | Admitting: Adult Health

## 2022-07-29 VITALS — BP 128/70 | HR 73 | Temp 98.2°F | Ht 61.0 in | Wt 130.6 lb

## 2022-07-29 DIAGNOSIS — G4733 Obstructive sleep apnea (adult) (pediatric): Secondary | ICD-10-CM

## 2022-07-29 NOTE — Progress Notes (Signed)
@Patient  ID: , female    DOB: 02/01/1951, 71 y.o.   MRN: [id number]  Chief Complaint  Patient presents with   Consult    Referring provider: [id number], Maud Deed  HPI: 71 year old female seen for sleep consult July 29, 2022 to establish for sleep apnea  TEST/EVENTS :   07/29/2022 Sleep consult  Patient presents for a sleep consult today to establish for sleep apnea.  Kindly referred by primary care provider 09/28/2022, PA .  Patient complains of snoring, restless sleep and daytime sleepiness.  Patient says she was diagnosed with sleep apnea years ago.  She has had her CPAP machine for greater than 10 years.  She says she is worn it a few times but does not like the mask and never got used to her CPAP.  Patient does not take any sleep aids.  Drinks about half a cup of caffeine daily.  No known history of congestive heart failure or stroke.  No symptoms suspicious for cataplexy or sleep paralysis.  Typically goes to sleep about 10 PM.  Takes about an hour to go to sleep.  Is up several times throughout the night.  Gets up about 6 AM.  Weight is stable over the last 2 years.  Current weight is at 130 with a BMI at 24.  Epworth score is 14 out of 24.  Patient says she gets very sleepy if she sits down to watch TV read or becomes still.  Typically gets sleepy in the afternoon hours or if she is the passenger of a car.  Patient says she wants to see if she still has sleep apnea and needs to restart back on CPAP.  Medical history significant for hypertension coronary artery disease diabetes, hyperlipidemia and GERD  Surgical history-PCI to RCA in 2020  Family history positive for heart disease  Social history patient is from 2021.  She has been in [place] for 33 years.  She is retired from a [employer] [place].  She has 3 adult sons.  She is married and lives at home with her husband.  She is a never smoker no alcohol or drug use.   No Known Allergies  Immunization  History  Administered Date(s) Administered   PFIZER(Purple Top)SARS-COV-2 Vaccination 09/15/2020    Past Medical History:  Diagnosis Date   Diabetes mellitus without complication (HCC)    type 2 previously well controlled with diet and excercise ,medication iintitated 2010   GERD (gastroesophageal reflux disease)    Hyperlipidemia    mixed   Hypertension    Osteopenia 04/2018   T score -2.2 FRAX 11% / 1.9% statistically significant loss at all measured sites.   Panic attacks    Postmenopausal    Tricuspid regurgitation    with resulting heart murmur    Vitamin D deficiency     Tobacco History: Social History   Tobacco Use  Smoking Status Never   Passive exposure: Past  Smokeless Tobacco Never   Counseling given: Not Answered   Outpatient Medications Prior to Visit  Medication Sig Dispense Refill   amLODipine (NORVASC) 10 MG tablet Take 10 mg by mouth daily.     atorvastatin (LIPITOR) 10 MG tablet Take 10 mg by mouth daily.     canagliflozin (INVOKANA) 100 MG TABS tablet TAKE ONE TABLET BY MOUTH ONCE DAILY     cholecalciferol (VITAMIN D) 1000 UNITS tablet Take 1,000 Units by mouth daily.     fish oil-omega-3 fatty acids 1000 MG capsule Take  2 g by mouth daily.     gemfibrozil (LOPID) 600 MG tablet Take 600 mg by mouth 2 (two) times daily before a meal.      Glucose Blood (BLOOD GLUCOSE TEST STRIPS) STRP Check fasting glucose readings once daily OneTouch please     Lifitegrast 5 % SOLN      lisinopril-hydrochlorothiazide (PRINZIDE,ZESTORETIC) 20-12.5 MG per tablet Take 1 tablet by mouth daily.     lovastatin (MEVACOR) 40 MG tablet      meloxicam (MOBIC) 7.5 MG tablet Take 1 tablet (7.5 mg total) by mouth daily. 14 tablet 0   metFORMIN (GLUCOPHAGE) 500 MG tablet Take by mouth 2 (two) times daily with a meal.     Multiple Vitamin (MULTIVITAMIN) tablet Take 1 tablet by mouth daily.     NITROSTAT 0.3 MG SL tablet Place 0.3 mg under the tongue every 5 (five) minutes as  needed.      omeprazole (PRILOSEC) 40 MG capsule Take 40 mg by mouth daily.     omeprazole (PRILOSEC) 40 MG capsule TAKE ONE CAPSULE BY MOUTH ONCE DAILY     No facility-administered medications prior to visit.     Review of Systems:   Constitutional:   No  weight loss, night sweats,  Fevers, chills,  +fatigue, or  lassitude.  HEENT:   No headaches,  Difficulty swallowing,  Tooth/dental problems, or  Sore throat,                No sneezing, itching, ear ache, nasal congestion, post nasal drip,   CV:  No chest pain,  Orthopnea, PND, swelling in lower extremities, anasarca, dizziness, palpitations, syncope.   GI  No heartburn, indigestion, abdominal pain, nausea, vomiting, diarrhea, change in bowel habits, loss of appetite, bloody stools.   Resp: No shortness of breath with exertion or at rest.  No excess mucus, no productive cough,  No non-productive cough,  No coughing up of blood.  No change in color of mucus.  No wheezing.  No chest wall deformity  Skin: no rash or lesions.  GU: no dysuria, change in color of urine, no urgency or frequency.  No flank pain, no hematuria   MS:  No joint pain or swelling.  No decreased range of motion.  No back pain.    Physical Exam  BP 128/70 (BP Location: Left Arm, Patient Position: Sitting, Cuff Size: Normal)   Pulse 73   Temp 98.2 F (36.8 C) (Oral)   Ht 5\' 1"  (1.549 m)   Wt 130 lb 9.6 oz (59.2 kg)   SpO2 98%   BMI 24.68 kg/m   GEN: A/Ox3; pleasant , NAD, well nourished    HEENT:  [place]/AT,  , NOSE-clear, THROAT-clear, no lesions, no postnasal drip or exudate noted.  Class III MP airway  NECK:  Supple w/ fair ROM; no JVD; normal carotid impulses w/o bruits; no thyromegaly or nodules palpated; no lymphadenopathy.    RESP  Clear  P & A; w/o, wheezes/ rales/ or rhonchi. no accessory muscle use, no dullness to percussion  CARD:  RRR, no m/r/g, no peripheral edema, pulses intact, no cyanosis or clubbing.  GI:   Soft & nt; nml bowel  sounds; no organomegaly or masses detected.   Musco: Warm bil, no deformities or joint swelling noted.   Neuro: alert, no focal deficits noted.    Skin: Warm, no lesions or rashes    Lab Results:    BMET BNP No results found for: "BNP"  ProBNP No results  found for: "PROBNP"  Imaging: No results found.        No data to display          No results found for: "NITRICOXIDE"      Assessment & Plan:   OSA (obstructive sleep apnea) History of obstructive sleep apnea.  Patient has significant symptom burden with snoring, restless sleep, daytime sleepiness and high Epworth score.  Patient will be set up for a home sleep study to evaluate for persistent sleep apnea.  - discussed how weight can impact sleep and risk for sleep disordered breathing - discussed options to assist with weight loss: combination of diet modification, cardiovascular and strength training exercises   - had an extensive discussion regarding the adverse health consequences related to untreated sleep disordered breathing - specifically discussed the risks for hypertension, coronary artery disease, cardiac dysrhythmias, cerebrovascular disease, and diabetes - lifestyle modification discussed   - discussed how sleep disruption can increase risk of accidents, particularly when driving - safe driving practices were discussed   Plan  Patient Instructions  Set up for home sleep study  Healthy sleep regimen  Do not drive if sleepy  Follow up in 2 months to discuss sleep study results and treatment plan.        Rubye Oaks, NP 07/29/2022

## 2022-07-29 NOTE — Patient Instructions (Signed)
Set up for home sleep study Healthy sleep regimen Do not drive if sleepy Follow up in 2 months to discuss sleep study results and treatment plan 

## 2022-07-29 NOTE — Assessment & Plan Note (Signed)
History of obstructive sleep apnea.  Patient has significant symptom burden with snoring, restless sleep, daytime sleepiness and high Epworth score.  Patient will be set up for a home sleep study to evaluate for persistent sleep apnea.  - discussed how weight can impact sleep and risk for sleep disordered breathing - discussed options to assist with weight loss: combination of diet modification, cardiovascular and strength training exercises   - had an extensive discussion regarding the adverse health consequences related to untreated sleep disordered breathing - specifically discussed the risks for hypertension, coronary artery disease, cardiac dysrhythmias, cerebrovascular disease, and diabetes - lifestyle modification discussed   - discussed how sleep disruption can increase risk of accidents, particularly when driving - safe driving practices were discussed   Plan  Patient Instructions  Set up for home sleep study  Healthy sleep regimen  Do not drive if sleepy  Follow up in 2 months to discuss sleep study results and treatment plan.

## 2022-07-30 NOTE — Progress Notes (Signed)
Reviewed and agree with assessment/plan.   Lanett Lasorsa, MD [facility] 07/30/2022, 8:16 AM Pager:  [phone]  

## 2022-10-04 ENCOUNTER — Ambulatory Visit: Payer: Medicare HMO | Admitting: Adult Health

## 2022-10-18 ENCOUNTER — Ambulatory Visit: Payer: Medicare HMO | Admitting: Adult Health

## 2022-10-22 ENCOUNTER — Ambulatory Visit: Payer: Medicare HMO

## 2022-10-22 DIAGNOSIS — G4733 Obstructive sleep apnea (adult) (pediatric): Secondary | ICD-10-CM

## 2022-10-25 ENCOUNTER — Ambulatory Visit: Payer: Medicare HMO | Admitting: Adult Health

## 2022-11-02 ENCOUNTER — Telehealth: Payer: Self-pay | Admitting: Pulmonary Disease

## 2022-11-02 DIAGNOSIS — G4733 Obstructive sleep apnea (adult) (pediatric): Secondary | ICD-10-CM | POA: Diagnosis not present

## 2022-11-02 IMAGING — DX DG KNEE AP/LAT W/ SUNRISE*L*
3 series · 3 of 3 positions shown · non-contrast
Comparison: None.

CLINICAL DATA: left knee pain

EXAM:
LEFT KNEE 3 VIEWS

[knee ap]
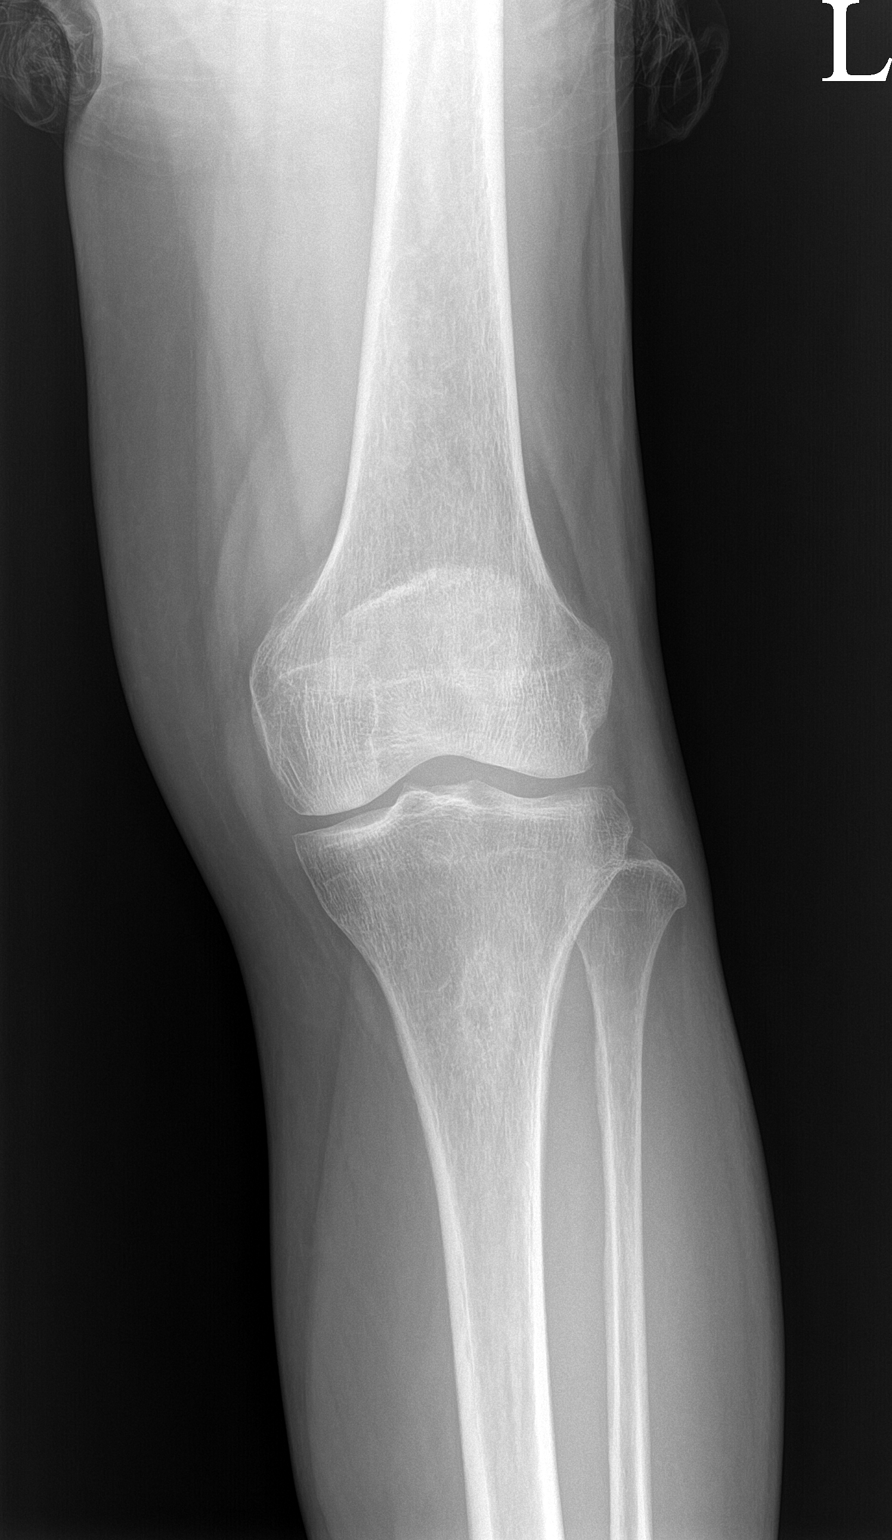

[knee lat]
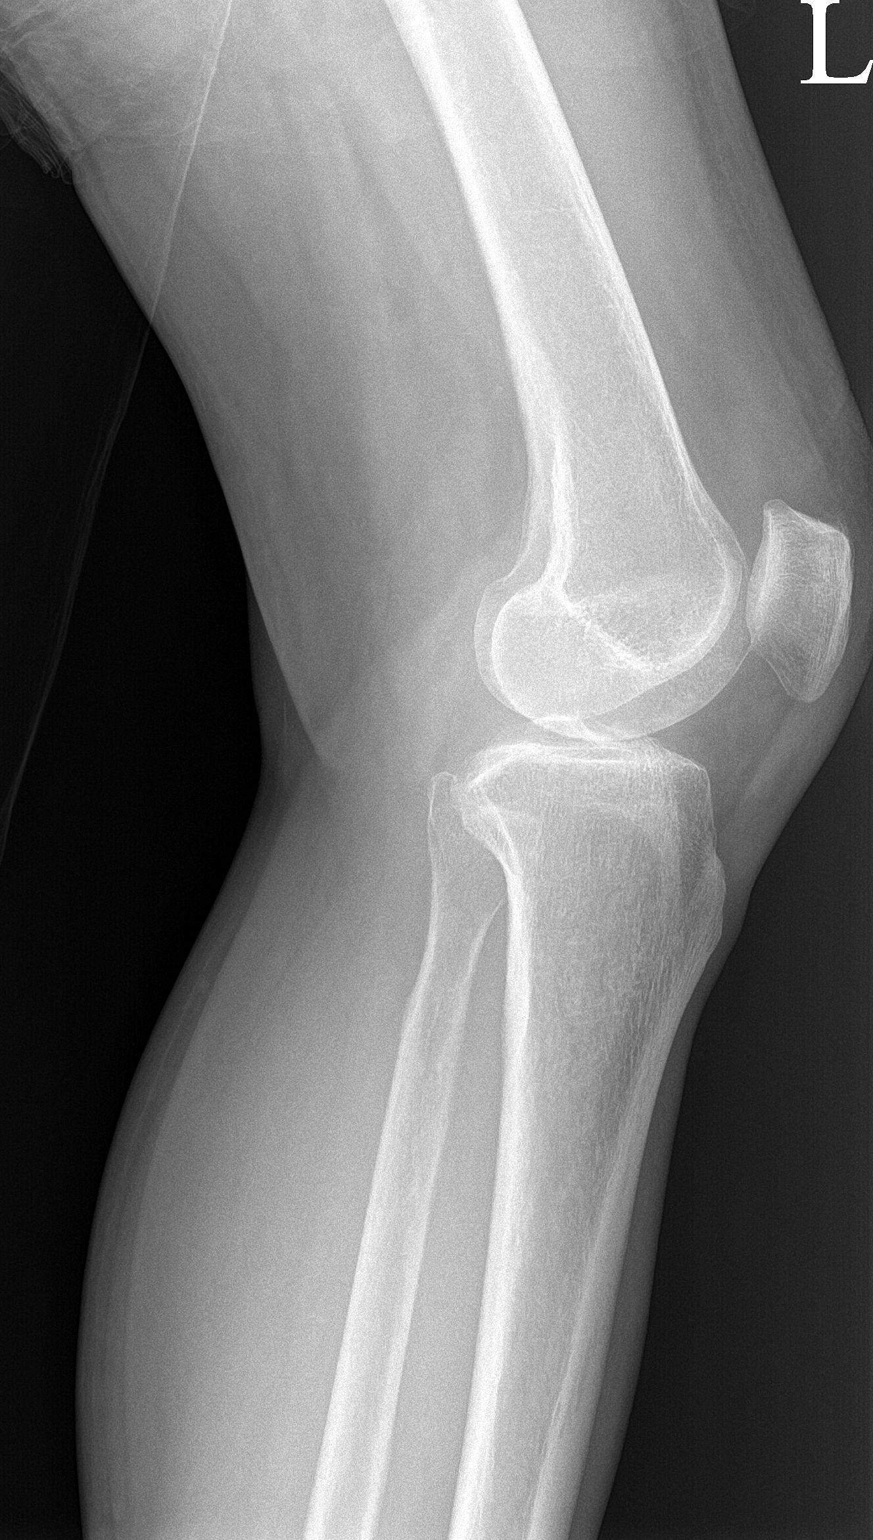

[patella]
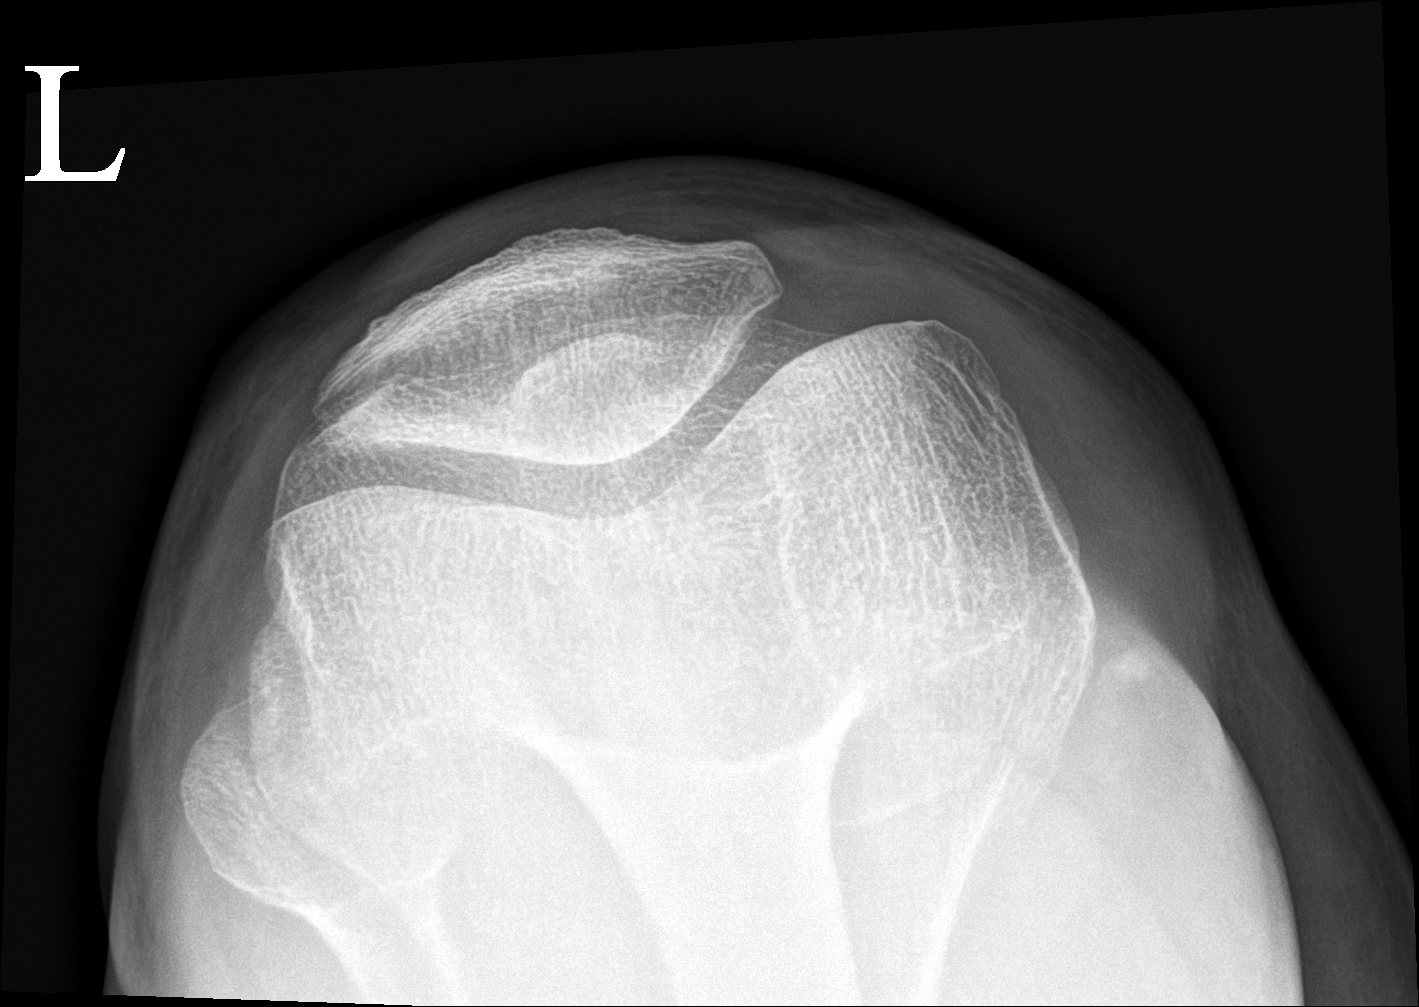

[3 of 3 positions shown; findings below may reference images not displayed]

FINDINGS: Osteopenia. No acute fracture or dislocation. Joint spaces and
alignment are maintained. No area of erosion or osseous destruction.
No unexpected radiopaque foreign body. Soft tissues are
unremarkable.
IMPRESSION: No acute fracture or dislocation.

## 2022-11-02 NOTE — Telephone Encounter (Signed)
Call patient  Sleep study result  Date of study: 10/22/2022  Impression: Severe obstructive sleep apnea Moderate oxygen desaturations  Recommendation: DME referral  Recommend CPAP therapy for severe obstructive sleep apnea  Auto titrating CPAP with pressure settings of 5-20 will be appropriate  Encourage weight loss measures  Follow-up in the office 4 to 6 weeks following initiation of treatment

## 2022-11-08 ENCOUNTER — Encounter: Payer: Self-pay | Admitting: Adult Health

## 2022-11-08 ENCOUNTER — Ambulatory Visit: Payer: Medicare HMO | Admitting: Adult Health

## 2022-11-08 VITALS — BP 112/64 | HR 65 | Temp 98.0°F | Ht 61.0 in | Wt 132.4 lb

## 2022-11-08 DIAGNOSIS — G4733 Obstructive sleep apnea (adult) (pediatric): Secondary | ICD-10-CM | POA: Diagnosis not present

## 2022-11-08 NOTE — Progress Notes (Signed)
@Patient  ID: , female    DOB: 1951/10/11, 71 y.o.   MRN: [id number]  Chief Complaint  Patient presents with   Follow-up    Referring provider: [id number], Maud Deed  HPI: 71 year old female seen for sleep consult July 29, 2022 to establish for sleep apnea Medical history significant for diabetes  TEST/EVENTS :  Home sleep study October 22, 2022 showed severe sleep apnea with a AHI at 55.1/hour and SpO2 low at 59%.  11/08/2022 Follow up : OSA  Patient presents for a 69-month follow-up.  Patient was seen last visit for sleep consult.  Patient previously been diagnosed with sleep apnea years ago.  Wore CPAP briefly but felt she did not need it and did not tolerate it for long.  Patient has recently been having significant snoring, daytime sleepiness and fatigue.  She was set up for a home sleep study that was done on October 22, 2022 that showed severe sleep apnea with AHI 55.1/hour and SpO2 low at 59%.  We discussed in detail her sleep study results.  Went over potential complications of untreated sleep apnea.  Patient is in agreement to restart CPAP.  No Known Allergies  Immunization History  Administered Date(s) Administered   Fluad Quad(high Dose 65+) 10/24/2018, 12/17/2019, 12/29/2020, 08/10/2021, 08/05/2022   Influenza Split 09/12/2012   Influenza,inj,Quad PF,6+ Mos 08/08/2018   Influenza,trivalent, recombinat, inj, PF 09/12/2012   Influenza-Unspecified 09/20/2013, 10/02/2015, 08/08/2018   PFIZER(Purple Top)SARS-COV-2 Vaccination 02/11/2020, 02/21/2020, 03/03/2020, 09/15/2020   Pneumococcal Conjugate-13 10/24/2018   Pneumococcal Polysaccharide-23 06/13/2013, 05/05/2020   Tdap 03/14/2012, 03/30/2016   Zoster Recombinat (Shingrix) 02/01/2022    Past Medical History:  Diagnosis Date   Diabetes mellitus without complication (HCC)    type 2 previously well controlled with diet and excercise ,medication iintitated 2010   GERD (gastroesophageal reflux disease)     Hyperlipidemia    mixed   Hypertension    Osteopenia 04/2018   T score -2.2 FRAX 11% / 1.9% statistically significant loss at all measured sites.   Panic attacks    Postmenopausal    Tricuspid regurgitation    with resulting heart murmur    Vitamin D deficiency     Tobacco History: Social History   Tobacco Use  Smoking Status Never   Passive exposure: Past  Smokeless Tobacco Never   Counseling given: Not Answered   Outpatient Medications Prior to Visit  Medication Sig Dispense Refill   amLODipine (NORVASC) 10 MG tablet Take 10 mg by mouth daily.     atorvastatin (LIPITOR) 10 MG tablet Take 10 mg by mouth daily.     canagliflozin (INVOKANA) 100 MG TABS tablet TAKE ONE TABLET BY MOUTH ONCE DAILY     cholecalciferol (VITAMIN D) 1000 UNITS tablet Take 1,000 Units by mouth daily.     fish oil-omega-3 fatty acids 1000 MG capsule Take 2 g by mouth daily.     gemfibrozil (LOPID) 600 MG tablet Take 600 mg by mouth 2 (two) times daily before a meal.      Glucose Blood (BLOOD GLUCOSE TEST STRIPS) STRP Check fasting glucose readings once daily OneTouch please     Lifitegrast 5 % SOLN      lisinopril-hydrochlorothiazide (PRINZIDE,ZESTORETIC) 20-12.5 MG per tablet Take 1 tablet by mouth daily.     lovastatin (MEVACOR) 40 MG tablet      meloxicam (MOBIC) 7.5 MG tablet Take 1 tablet (7.5 mg total) by mouth daily. 14 tablet 0   metFORMIN (GLUCOPHAGE) 500 MG tablet Take by mouth  2 (two) times daily with a meal.     Multiple Vitamin (MULTIVITAMIN) tablet Take 1 tablet by mouth daily.     NITROSTAT 0.3 MG SL tablet Place 0.3 mg under the tongue every 5 (five) minutes as needed.      omeprazole (PRILOSEC) 40 MG capsule Take 40 mg by mouth daily.     omeprazole (PRILOSEC) 40 MG capsule TAKE ONE CAPSULE BY MOUTH ONCE DAILY     No facility-administered medications prior to visit.     Review of Systems:   Constitutional:   No  weight loss, night sweats,  Fevers, chills,  +fatigue, or   lassitude.  HEENT:   No headaches,  Difficulty swallowing,  Tooth/dental problems, or  Sore throat,                No sneezing, itching, ear ache, nasal congestion, post nasal drip,   CV:  No chest pain,  Orthopnea, PND, swelling in lower extremities, anasarca, dizziness, palpitations, syncope.   GI  No heartburn, indigestion, abdominal pain, nausea, vomiting, diarrhea, change in bowel habits, loss of appetite, bloody stools.   Resp: No shortness of breath with exertion or at rest.  No excess mucus, no productive cough,  No non-productive cough,  No coughing up of blood.  No change in color of mucus.  No wheezing.  No chest wall deformity  Skin: no rash or lesions.  GU: no dysuria, change in color of urine, no urgency or frequency.  No flank pain, no hematuria   MS:  No joint pain or swelling.  No decreased range of motion.  No back pain.    Physical Exam  BP 112/64 (BP Location: Left Arm, Patient Position: Sitting, Cuff Size: Normal)   Pulse 65   Temp 98 F (36.7 C) (Oral)   Ht 5\' 1"  (1.549 m)   Wt 132 lb 6.4 oz (60.1 kg)   SpO2 98%   BMI 25.02 kg/m   GEN: A/Ox3; pleasant , NAD, well nourished    HEENT:  [place]/AT,  NOSE-clear, THROAT-clear, no lesions, no postnasal drip or exudate noted. Class 3 MP airway   NECK:  Supple w/ fair ROM; no JVD; normal carotid impulses w/o bruits; no thyromegaly or nodules palpated; no lymphadenopathy.    RESP  Clear  P & A; w/o, wheezes/ rales/ or rhonchi. no accessory muscle use, no dullness to percussion  CARD:  RRR, no m/r/g, no peripheral edema, pulses intact, no cyanosis or clubbing.  GI:   Soft & nt; nml bowel sounds; no organomegaly or masses detected.   Musco: Warm bil, no deformities or joint swelling noted.   Neuro: alert, no focal deficits noted.    Skin: Warm, no lesions or rashes    Lab Results:   BMET No results found for: "NA", "K", "CL", "CO2", "GLUCOSE", "BUN", "CREATININE", "CALCIUM", "GFRNONAA", "GFRAA"  BNP No  results found for: "BNP"  ProBNP No results found for: "PROBNP"  Imaging: No results found.        No data to display          No results found for: "NITRICOXIDE"      Assessment & Plan:   OSA (obstructive sleep apnea) Severe obstructive sleep apnea.  Patient education given on sleep apnea potential complications of untreated sleep apnea. Patient will begin auto CPAP 5 to 15 cm H2O.  Before she was unable to tolerate full facemask.  Will try a DreamWear nasal mask.  - discussed how weight can impact sleep and  risk for sleep disordered breathing - discussed options to assist with weight loss: combination of diet modification, cardiovascular and strength training exercises   - had an extensive discussion regarding the adverse health consequences related to untreated sleep disordered breathing - specifically discussed the risks for hypertension, coronary artery disease, cardiac dysrhythmias, cerebrovascular disease, and diabetes - lifestyle modification discussed   - discussed how sleep disruption can increase risk of accidents, particularly when driving - safe driving practices were discussed   Plan  Patient Instructions  Begin CPAP At bedtime  , wear all night long for at least 6hr or more  Try Dream wear nasal mask .  Work on healthy weight loss.  Do not drive if sleepy  Follow up in 3 months and As needed        Rubye Oaks, NP 11/08/2022

## 2022-11-08 NOTE — Assessment & Plan Note (Signed)
Severe obstructive sleep apnea.  Patient education given on sleep apnea potential complications of untreated sleep apnea. Patient will begin auto CPAP 5 to 15 cm H2O.  Before she was unable to tolerate full facemask.  Will try a DreamWear nasal mask.  - discussed how weight can impact sleep and risk for sleep disordered breathing - discussed options to assist with weight loss: combination of diet modification, cardiovascular and strength training exercises   - had an extensive discussion regarding the adverse health consequences related to untreated sleep disordered breathing - specifically discussed the risks for hypertension, coronary artery disease, cardiac dysrhythmias, cerebrovascular disease, and diabetes - lifestyle modification discussed   - discussed how sleep disruption can increase risk of accidents, particularly when driving - safe driving practices were discussed   Plan  Patient Instructions  Begin CPAP At bedtime  , wear all night long for at least 6hr or more  Try Dream wear nasal mask .  Work on healthy weight loss.  Do not drive if sleepy  Follow up in 3 months and As needed

## 2022-11-08 NOTE — Patient Instructions (Signed)
Begin CPAP At bedtime  , wear all night long for at least 6hr or more  Try Dream wear nasal mask .  Work on healthy weight loss.  Do not drive if sleepy  Follow up in 3 months and As needed

## 2023-02-07 ENCOUNTER — Ambulatory Visit: Payer: Medicare HMO | Admitting: Adult Health

## 2023-02-07 ENCOUNTER — Encounter: Payer: Self-pay | Admitting: Adult Health

## 2023-02-07 VITALS — BP 100/60 | HR 71 | Temp 98.3°F | Ht 61.0 in | Wt 133.2 lb

## 2023-02-07 DIAGNOSIS — G4733 Obstructive sleep apnea (adult) (pediatric): Secondary | ICD-10-CM | POA: Diagnosis not present

## 2023-02-07 NOTE — Assessment & Plan Note (Signed)
Excellent control and compliance on CPAP -no changes   Plan  Patient Instructions  Continue on CPAP at bedtime  , wear all night long for at least 6hr or more  Work on healthy weight.  Do not drive if sleepy  Healthy sleep regimen .  Follow up in 6 months and As needed

## 2023-02-07 NOTE — Patient Instructions (Addendum)
Continue on CPAP at bedtime  , wear all night long for at least 6hr or more  Work on healthy weight.  Do not drive if sleepy  Healthy sleep regimen .  Follow up in 6 months and As needed

## 2023-02-07 NOTE — Progress Notes (Signed)
@Patient  ID: [name], female    DOB: 1951/11/06, 72 y.o.   MRN: [id number]  Chief Complaint  Patient presents with   Follow-up    Referring provider: Willeen Niece, [place]  HPI: 72 year old female seen for sleep consult July 29, 2022 to establish for sleep apnea Medical history significant for diabetes   TEST/EVENTS :  Home sleep study October 22, 2022 showed severe sleep apnea with a AHI at 55.1/hour and SpO2 low at 59%.   02/07/2023 Follow up ; OSA  Patient presents for a 63-month follow-up.  Patient has underlying severe sleep apnea.  Patient was restarted back on CPAP.  Patient says she is doing much better on CPAP.  Is using the nasal pillows which are much more comfortable.  Patient says he is wearing his CPAP every single night.  Does feel that he benefits from CPAP with decreased daytime sleepiness.  CPAP download shows excellent compliance with 97% usage.  Daily average usage to 5 hours.  Patient is on auto CPAP 5 to 15 cm H2O.  Daily average pressure at 12.2 cm H2O.  AHI is 3.7/hour. Feels she is not getting up at night as much, only gets up 1 x to go to bathroom.  Feels so much better on CPAP .   No Known Allergies  Immunization History  Administered Date(s) Administered   Fluad Quad(high Dose 65+) 10/24/2018, 12/17/2019, 12/29/2020, 08/10/2021, 08/05/2022   Influenza Split 09/12/2012   Influenza,inj,Quad PF,6+ Mos 08/08/2018   Influenza,trivalent, recombinat, inj, PF 09/12/2012   Influenza-Unspecified 09/20/2013, 10/02/2015, 08/08/2018   PFIZER(Purple Top)SARS-COV-2 Vaccination 02/11/2020, 02/21/2020, 03/03/2020, 09/15/2020   Pneumococcal Conjugate-13 10/24/2018   Pneumococcal Polysaccharide-23 06/13/2013, 05/05/2020   Tdap 03/14/2012, 03/30/2016   Zoster Recombinat (Shingrix) 02/01/2022    Past Medical History:  Diagnosis Date   Diabetes mellitus without complication ([place])    type 2 previously well controlled with diet and excercise ,medication iintitated  2010   GERD (gastroesophageal reflux disease)    Hyperlipidemia    mixed   Hypertension    Osteopenia 04/2018   T score -2.2 FRAX 11% / 1.9% statistically significant loss at all measured sites.   Panic attacks    Postmenopausal    Tricuspid regurgitation    with resulting heart murmur    Vitamin D deficiency     Tobacco History: Social History   Tobacco Use  Smoking Status Never   Passive exposure: Past  Smokeless Tobacco Never   Counseling given: Not Answered   Outpatient Medications Prior to Visit  Medication Sig Dispense Refill   amLODipine (NORVASC) 10 MG tablet Take 10 mg by mouth daily.     atorvastatin (LIPITOR) 10 MG tablet Take 10 mg by mouth daily.     canagliflozin (INVOKANA) 100 MG TABS tablet TAKE ONE TABLET BY MOUTH ONCE DAILY     cholecalciferol (VITAMIN D) 1000 UNITS tablet Take 1,000 Units by mouth daily.     Dulaglutide 0.75 MG/0.5ML SOPN Inject 0.75 mg into the skin once a week.     fish oil-omega-3 fatty acids 1000 MG capsule Take 2 g by mouth daily.     gemfibrozil (LOPID) 600 MG tablet Take 600 mg by mouth 2 (two) times daily before a meal.      Glucose Blood (BLOOD GLUCOSE TEST STRIPS) STRP Check fasting glucose readings once daily OneTouch please     Lifitegrast 5 % SOLN      lisinopril-hydrochlorothiazide (PRINZIDE,ZESTORETIC) 20-12.5 MG per tablet Take 1 tablet by mouth daily.  lovastatin (MEVACOR) 40 MG tablet      meloxicam (MOBIC) 7.5 MG tablet Take 1 tablet (7.5 mg total) by mouth daily. 14 tablet 0   metFORMIN (GLUCOPHAGE) 500 MG tablet Take by mouth 2 (two) times daily with a meal.     Multiple Vitamin (MULTIVITAMIN) tablet Take 1 tablet by mouth daily.     NITROSTAT 0.3 MG SL tablet Place 0.3 mg under the tongue every 5 (five) minutes as needed.      omeprazole (PRILOSEC) 40 MG capsule Take 40 mg by mouth daily.     omeprazole (PRILOSEC) 40 MG capsule TAKE ONE CAPSULE BY MOUTH ONCE DAILY     No facility-administered medications prior  to visit.     Review of Systems:   Constitutional:   No  weight loss, night sweats,  Fevers, chills, fatigue, or  lassitude.  HEENT:   No headaches,  Difficulty swallowing,  Tooth/dental problems, or  Sore throat,                No sneezing, itching, ear ache, nasal congestion, post nasal drip,   CV:  No chest pain,  Orthopnea, PND, swelling in lower extremities, anasarca, dizziness, palpitations, syncope.   GI  No heartburn, indigestion, abdominal pain, nausea, vomiting, diarrhea, change in bowel habits, loss of appetite, bloody stools.   Resp: No shortness of breath with exertion or at rest.  No excess mucus, no productive cough,  No non-productive cough,  No coughing up of blood.  No change in color of mucus.  No wheezing.  No chest wall deformity  Skin: no rash or lesions.  GU: no dysuria, change in color of urine, no urgency or frequency.  No flank pain, no hematuria   MS:  No joint pain or swelling.  No decreased range of motion.  No back pain.    Physical Exam  BP 100/60 (BP Location: Left Arm, Patient Position: Sitting, Cuff Size: Normal)   Pulse 71   Temp 98.3 F (36.8 C) (Oral)   Ht 5\' 1"  (1.549 m)   Wt 133 lb 3.2 oz (60.4 kg)   SpO2 97%   BMI 25.17 kg/m   GEN: A/Ox3; pleasant , NAD, well nourished    HEENT:  [place]/AT,  NOSE-clear, THROAT-clear, no lesions, no postnasal drip or exudate noted.   NECK:  Supple w/ fair ROM; no JVD; normal carotid impulses w/o bruits; no thyromegaly or nodules palpated; no lymphadenopathy.    RESP  Clear  P & A; w/o, wheezes/ rales/ or rhonchi. no accessory muscle use, no dullness to percussion  CARD:  RRR, no m/r/g, no peripheral edema, pulses intact, no cyanosis or clubbing.  GI:   Soft & nt; nml bowel sounds; no organomegaly or masses detected.   Musco: Warm bil, no deformities or joint swelling noted.   Neuro: alert, no focal deficits noted.    Skin: Warm, no lesions or rashes    Lab Results:  CBC   BMET No  results found for: "NA", "K", "CL", "CO2", "GLUCOSE", "BUN", "CREATININE", "CALCIUM", "GFRNONAA", "GFRAA"  BNP No results found for: "BNP"  ProBNP No results found for: "PROBNP"  Imaging: No results found.        No data to display          No results found for: "NITRICOXIDE"      Assessment & Plan:   OSA (obstructive sleep apnea) Excellent control and compliance on CPAP -no changes   Plan  Patient Instructions  Continue on CPAP  at bedtime  , wear all night long for at least 6hr or more  Work on healthy weight.  Do not drive if sleepy  Healthy sleep regimen .  Follow up in 6 months and As needed        Rexene Edison, NP 02/07/2023

## 2023-08-08 ENCOUNTER — Ambulatory Visit: Payer: Medicare HMO | Admitting: Adult Health

## 2023-09-19 ENCOUNTER — Encounter: Payer: Self-pay | Admitting: Adult Health

## 2023-09-19 ENCOUNTER — Ambulatory Visit: Payer: Medicare HMO | Admitting: Adult Health

## 2023-09-19 VITALS — BP 108/80 | HR 81 | Ht 61.0 in | Wt 131.8 lb

## 2023-09-19 DIAGNOSIS — G4733 Obstructive sleep apnea (adult) (pediatric): Secondary | ICD-10-CM | POA: Diagnosis not present

## 2023-09-19 NOTE — Progress Notes (Signed)
@Patient  ID: [name], female    DOB: 06/14/1951, 72 y.o.   MRN: [id number]  Chief Complaint  Patient presents with   Follow-up    Referring provider: Maud Deed, [place]  HPI: 72 year old female seen for sleep consult July 29, 2022 to establish for severe sleep apnea Medical history significant for diabetes  TEST/EVENTS :  Home sleep study October 22, 2022 showed severe sleep apnea with a AHI at 55.1/hour and SpO2 low at 59%.   09/19/2023 Follow up : OSA  Patient presents for 70-month follow-up.  Patient has underlying severe obstructive sleep apnea.  Patient is on CPAP at bedtime.  She is currently using nasal pillows.  Patient feels that she benefits from CPAP with decreased daytime sleepiness.  CPAP download shows excellent compliance with daily average usage at 5 hours.  Patient is on auto CPAP 5 to 15 cm H2O.  Daily average pressure at 12.2cmH2O. AHI 2.2/hr.  Does complain that mask leaves marks on face in the am, no skin irritation.     No Known Allergies  Immunization History  Administered Date(s) Administered   Fluad Quad(high Dose 65+) 10/24/2018, 12/17/2019, 12/29/2020, 08/10/2021, 08/05/2022   Influenza Split 09/12/2012   Influenza,inj,Quad PF,6+ Mos 08/08/2018   Influenza,trivalent, recombinat, inj, PF 09/12/2012   Influenza-Unspecified 09/20/2013, 10/02/2015, 08/08/2018   PFIZER(Purple Top)SARS-COV-2 Vaccination 02/11/2020, 02/21/2020, 03/03/2020, 09/15/2020   Pneumococcal Conjugate-13 10/24/2018   Pneumococcal Polysaccharide-23 06/13/2013, 05/05/2020   Tdap 03/14/2012, 03/30/2016   Zoster Recombinant(Shingrix) 02/01/2022    Past Medical History:  Diagnosis Date   Diabetes mellitus without complication ([facility])    type 2 previously well controlled with diet and excercise ,medication iintitated 2010   GERD (gastroesophageal reflux disease)    Hyperlipidemia    mixed   Hypertension    Osteopenia 04/2018   T score -2.2 FRAX 11% / 1.9% statistically  significant loss at all measured sites.   Panic attacks    Postmenopausal    Tricuspid regurgitation    with resulting heart murmur    Vitamin D deficiency     Tobacco History: Social History   Tobacco Use  Smoking Status Never   Passive exposure: Past  Smokeless Tobacco Never   Counseling given: Not Answered   Outpatient Medications Prior to Visit  Medication Sig Dispense Refill   amLODipine (NORVASC) 10 MG tablet Take 10 mg by mouth daily.     atorvastatin (LIPITOR) 10 MG tablet Take 10 mg by mouth daily.     canagliflozin (INVOKANA) 100 MG TABS tablet TAKE ONE TABLET BY MOUTH ONCE DAILY     cholecalciferol (VITAMIN D) 1000 UNITS tablet Take 1,000 Units by mouth daily.     Dulaglutide 0.75 MG/0.5ML SOPN Inject 0.75 mg into the skin once a week.     fish oil-omega-3 fatty acids 1000 MG capsule Take 2 g by mouth daily.     gemfibrozil (LOPID) 600 MG tablet Take 600 mg by mouth 2 (two) times daily before a meal.      Glucose Blood (BLOOD GLUCOSE TEST STRIPS) STRP Check fasting glucose readings once daily OneTouch please     Lifitegrast 5 % SOLN      lisinopril-hydrochlorothiazide (PRINZIDE,ZESTORETIC) 20-12.5 MG per tablet Take 1 tablet by mouth daily.     lovastatin (MEVACOR) 40 MG tablet      meloxicam (MOBIC) 7.5 MG tablet Take 1 tablet (7.5 mg total) by mouth daily. 14 tablet 0   metFORMIN (GLUCOPHAGE) 500 MG tablet Take by mouth 2 (two) times daily  with a meal.     Multiple Vitamin (MULTIVITAMIN) tablet Take 1 tablet by mouth daily.     NITROSTAT 0.3 MG SL tablet Place 0.3 mg under the tongue every 5 (five) minutes as needed.      omeprazole (PRILOSEC) 40 MG capsule Take 40 mg by mouth daily.     omeprazole (PRILOSEC) 40 MG capsule TAKE ONE CAPSULE BY MOUTH ONCE DAILY     No facility-administered medications prior to visit.     Review of Systems:   Constitutional:   No  weight loss, night sweats,  Fevers, chills, fatigue, or  lassitude.  HEENT:   No headaches,   Difficulty swallowing,  Tooth/dental problems, or  Sore throat,                No sneezing, itching, ear ache, nasal congestion, post nasal drip,   CV:  No chest pain,  Orthopnea, PND, swelling in lower extremities, anasarca, dizziness, palpitations, syncope.   GI  No heartburn, indigestion, abdominal pain, nausea, vomiting, diarrhea, change in bowel habits, loss of appetite, bloody stools.   Resp: No shortness of breath with exertion or at rest.  No excess mucus, no productive cough,  No non-productive cough,  No coughing up of blood.  No change in color of mucus.  No wheezing.  No chest wall deformity  Skin: no rash or lesions.  GU: no dysuria, change in color of urine, no urgency or frequency.  No flank pain, no hematuria   MS:  No joint pain or swelling.  No decreased range of motion.  No back pain.    Physical Exam  BP 108/80 (BP Location: Left Arm, Patient Position: Sitting, Cuff Size: Normal)   Pulse 81   Ht 5\' 1"  (1.549 m)   Wt 131 lb 12.8 oz (59.8 kg)   SpO2 98%   BMI 24.90 kg/m   GEN: A/Ox3; pleasant , NAD, well nourished    HEENT:  [place]/AT,   NOSE-clear, THROAT-clear, no lesions, no postnasal drip or exudate noted.   NECK:  Supple w/ fair ROM; no JVD; normal carotid impulses w/o bruits; no thyromegaly or nodules palpated; no lymphadenopathy.    RESP  Clear  P & A; w/o, wheezes/ rales/ or rhonchi. no accessory muscle use, no dullness to percussion  CARD:  RRR, no m/r/g, no peripheral edema, pulses intact, no cyanosis or clubbing.  GI:   Soft & nt; nml bowel sounds; no organomegaly or masses detected.   Musco: Warm bil, no deformities or joint swelling noted.   Neuro: alert, no focal deficits noted.    Skin: Warm, no lesions or rashes    Lab Results:  CBC  No results found for: "NA", "K", "CL", "CO2", "GLUCOSE", "BUN", "CREATININE", "CALCIUM", "GFRNONAA", "GFRAA"  BNP No results found for: "BNP"  ProBNP No results found for: "PROBNP"  Imaging: No  results found.  Administration History     None           No data to display          No results found for: "NITRICOXIDE"      Assessment & Plan:   OSA (obstructive sleep apnea) Excellent control and compliance on CPAP   Plan  Patient Instructions  Continue on CPAP at bedtime  , wear all night long for at least 6hr or more  Work on healthy weight.  Do not drive if sleepy  Healthy sleep regimen .  Follow up in 1 year and As needed  Rubye Oaks, NP 09/19/2023

## 2023-09-19 NOTE — Patient Instructions (Signed)
Continue on CPAP at bedtime  , wear all night long for at least 6hr or more  Work on healthy weight.  Do not drive if sleepy  Healthy sleep regimen .  Follow up in 1 year and As needed

## 2023-09-19 NOTE — Assessment & Plan Note (Signed)
Excellent control and compliance on CPAP   Plan  Patient Instructions  Continue on CPAP at bedtime  , wear all night long for at least 6hr or more  Work on healthy weight.  Do not drive if sleepy  Healthy sleep regimen .  Follow up in 1 year and As needed

## 2024-08-21 ENCOUNTER — Ambulatory Visit: Admitting: Adult Health

## 2024-08-21 VITALS — BP 120/82 | HR 80 | Ht 61.0 in | Wt 120.6 lb

## 2024-08-21 DIAGNOSIS — G4733 Obstructive sleep apnea (adult) (pediatric): Secondary | ICD-10-CM | POA: Diagnosis not present

## 2024-08-21 DIAGNOSIS — G4709 Other insomnia: Secondary | ICD-10-CM

## 2024-08-21 NOTE — Progress Notes (Unsigned)
 @Patient  ID: [name], female    DOB: 24-Oct-1951, 73 y.o.   MRN: [id number]  Chief Complaint  Patient presents with   Sleep Apnea    Referring provider: Vonita Quince, [place]  HPI: 73 yo female seen for sleep consult September 2023 to establish for severe OSA Medical history significant for DM    TEST/EVENTS :  Home sleep study October 22, 2022 showed severe sleep apnea with a AHI at 55.1/hour and SpO2 low at 59%.    Discussed the use of AI scribe software for clinical note transcription with the patient, who gave verbal consent to proceed.  History of Present Illness [name] is a 73 year old female with severe sleep apnea who presents for a one-year follow-up for CPAP therapy.  A prior sleep study showed severe sleep apnea. She is on CPAP At bedtime .   She typically goes to sleep around 10 PM and wakes up between 1:30 AM and 2:30 AM. After waking, she sometimes removes the CPAP mask due to facial imprints left by the nasal pillows. She does not use the CPAP after waking up sometimes.   CPAP download shows excellent compliance with daily usage at 5.5hr. AHI 1.3/hr   No issues with caffeine intake, consuming only a small amount in the morning and avoiding coffee during the day. When she wakes up at night, she usually goes to the bathroom.  She has tried medication for sleep difficulties in the past but did not like it and does not use it frequently. She has used magnesium as a more natural supplement to aid sleep.     No Known Allergies  Immunization History  Administered Date(s) Administered   Fluad Quad(high Dose 65+) 10/24/2018, 12/17/2019, 12/29/2020, 08/10/2021, 08/05/2022   Influenza Split 09/12/2012   Influenza,inj,Quad PF,6+ Mos 08/08/2018   Influenza,trivalent, recombinat, inj, PF 09/12/2012   Influenza-Unspecified 09/20/2013, 10/02/2015, 08/08/2018   PFIZER(Purple Top)SARS-COV-2 Vaccination 02/11/2020, 02/21/2020, 03/03/2020, 09/15/2020    Pneumococcal Conjugate-13 10/24/2018   Pneumococcal Polysaccharide-23 06/13/2013, 05/05/2020   Tdap 03/14/2012, 03/30/2016   Zoster Recombinant(Shingrix) 02/01/2022    Past Medical History:  Diagnosis Date   Diabetes mellitus without complication ([facility])    type 2 previously well controlled with diet and excercise ,medication iintitated 2010   GERD (gastroesophageal reflux disease)    Hyperlipidemia    mixed   Hypertension    Osteopenia 04/2018   T score -2.2 FRAX 11% / 1.9% statistically significant loss at all measured sites.   Panic attacks    Postmenopausal    Tricuspid regurgitation    with resulting heart murmur    Vitamin D  deficiency     Tobacco History: Social History   Tobacco Use  Smoking Status Never   Passive exposure: Past  Smokeless Tobacco Never   Counseling given: Not Answered   Outpatient Medications Prior to Visit  Medication Sig Dispense Refill   amLODipine (NORVASC) 10 MG tablet Take 10 mg by mouth daily.     atorvastatin (LIPITOR) 10 MG tablet Take 10 mg by mouth daily.     canagliflozin (INVOKANA) 100 MG TABS tablet TAKE ONE TABLET BY MOUTH ONCE DAILY     cholecalciferol (VITAMIN D ) 1000 UNITS tablet Take 1,000 Units by mouth daily.     Dulaglutide 0.75 MG/0.5ML SOPN Inject 0.75 mg into the skin once a week.     fish oil-omega-3 fatty acids 1000 MG capsule Take 2 g by mouth daily.     gemfibrozil (LOPID) 600 MG tablet Take  600 mg by mouth 2 (two) times daily before a meal.      Glucose Blood (BLOOD GLUCOSE TEST STRIPS) STRP Check fasting glucose readings once daily OneTouch please     Lifitegrast 5 % SOLN      lisinopril-hydrochlorothiazide (PRINZIDE,ZESTORETIC) 20-12.5 MG per tablet Take 1 tablet by mouth daily.     lovastatin (MEVACOR) 40 MG tablet      meloxicam  (MOBIC ) 7.5 MG tablet Take 1 tablet (7.5 mg total) by mouth daily. 14 tablet 0   metFORMIN (GLUCOPHAGE) 500 MG tablet Take by mouth 2 (two) times daily with a meal.     Multiple Vitamin  (MULTIVITAMIN) tablet Take 1 tablet by mouth daily.     NITROSTAT 0.3 MG SL tablet Place 0.3 mg under the tongue every 5 (five) minutes as needed.      omeprazole (PRILOSEC) 40 MG capsule Take 40 mg by mouth daily.     omeprazole (PRILOSEC) 40 MG capsule TAKE ONE CAPSULE BY MOUTH ONCE DAILY     No facility-administered medications prior to visit.     Review of Systems:   Constitutional:   No  weight loss, night sweats,  Fevers, chills, fatigue, or  lassitude.  HEENT:   No headaches,  Difficulty swallowing,  Tooth/dental problems, or  Sore throat,                No sneezing, itching, ear ache, nasal congestion, post nasal drip,   CV:  No chest pain,  Orthopnea, PND, swelling in lower extremities, anasarca, dizziness, palpitations, syncope.   GI  No heartburn, indigestion, abdominal pain, nausea, vomiting, diarrhea, change in bowel habits, loss of appetite, bloody stools.   Resp: No shortness of breath with exertion or at rest.  No excess mucus, no productive cough,  No non-productive cough,  No coughing up of blood.  No change in color of mucus.  No wheezing.  No chest wall deformity  Skin: no rash or lesions.  GU: no dysuria, change in color of urine, no urgency or frequency.  No flank pain, no hematuria   MS:  No joint pain or swelling.  No decreased range of motion.  No back pain.    Physical Exam  BP 120/82 (BP Location: Left Arm, Patient Position: Sitting, Cuff Size: Normal)   Pulse 80   Ht 5' 1 (1.549 m)   Wt 120 lb 9.6 oz (54.7 kg)   SpO2 98% Comment: RA  BMI 22.79 kg/m   GEN: A/Ox3; pleasant , NAD, well nourished    HEENT:  [place]/AT,  NOSE-clear, THROAT-clear, no lesions, no postnasal drip or exudate noted.   NECK:  Supple w/ fair ROM; no JVD; normal carotid impulses w/o bruits; no thyromegaly or nodules palpated; no lymphadenopathy.    RESP  Clear  P & A; w/o, wheezes/ rales/ or rhonchi. no accessory muscle use, no dullness to percussion  CARD:  RRR, no m/r/g, no  peripheral edema, pulses intact, no cyanosis or clubbing.  GI:   Soft & nt; nml bowel sounds; no organomegaly or masses detected.   Musco: Warm bil, no deformities or joint swelling noted.   Neuro: alert, no focal deficits noted.    Skin: Warm, no lesions or rashes    Lab Results:    Imaging: No results found.  Administration History     None           No data to display          No results found for:  NITRICOXIDE      Assessment & Plan:   Assessment and Plan Assessment & Plan Obstructive sleep apnea   Severe obstructive sleep apnea is well-controlled with CPAP. She reports waking up at night and not using CPAP upon returning to sleep, CPAP use is crucial to prevent hypoxemia and cardiac stress, especially with diabetes. Encourage continuous CPAP use throughout the night. Recommend applying lotion in am  to reduce mask imprints. Advise using magnesium supplements and relaxation techniques to improve sleep quality. Instruct to maintain CPAP hygiene by wiping the mask and using distilled water in the water chamber.   - discussed how weight can impact sleep and risk for sleep disordered breathing - discussed options to assist with weight loss: combination of diet modification, cardiovascular and strength training exercises   - had an extensive discussion regarding the adverse health consequences related to untreated sleep disordered breathing - specifically discussed the risks for hypertension, coronary artery disease, cardiac dysrhythmias, cerebrovascular disease, and diabetes - lifestyle modification discussed   - discussed how sleep disruption can increase risk of accidents, particularly when driving - safe driving practices were discussed        Madelin Stank, NP 08/21/2024

## 2024-08-21 NOTE — Patient Instructions (Signed)
 Continue on CPAP at bedtime  , wear all night long for at least 6hr or more  Work on healthy weight.  Do not drive if sleepy  Healthy sleep regimen .  Follow up in 1 year and As needed
